# Patient Record
Sex: Female | Born: 1950 | Race: White | Hispanic: No | Marital: Married | Smoking: Current every day smoker
Health system: Southern US, Community
[De-identification: ages and names within clinical notes are randomized; demographics above are authoritative.]

## PROBLEM LIST (undated history)

## (undated) DIAGNOSIS — F32A Depression, unspecified: Secondary | ICD-10-CM

## (undated) DIAGNOSIS — Z780 Asymptomatic menopausal state: Secondary | ICD-10-CM

## (undated) DIAGNOSIS — F419 Anxiety disorder, unspecified: Secondary | ICD-10-CM

## (undated) DIAGNOSIS — F329 Major depressive disorder, single episode, unspecified: Secondary | ICD-10-CM

## (undated) HISTORY — DX: Anxiety disorder, unspecified: F41.9

## (undated) HISTORY — DX: Depression, unspecified: F32.A

## (undated) HISTORY — PX: CHOLECYSTECTOMY: SHX55

## (undated) HISTORY — DX: Asymptomatic menopausal state: Z78.0

## (undated) HISTORY — PX: OTHER SURGICAL HISTORY: SHX169

## (undated) HISTORY — DX: Major depressive disorder, single episode, unspecified: F32.9

---

## 2004-02-11 ENCOUNTER — Ambulatory Visit: Payer: Self-pay | Admitting: Family Medicine

## 2005-02-22 ENCOUNTER — Ambulatory Visit: Payer: Self-pay | Admitting: Family Medicine

## 2005-03-10 ENCOUNTER — Ambulatory Visit: Payer: Self-pay | Admitting: Family Medicine

## 2006-03-21 ENCOUNTER — Ambulatory Visit: Payer: Self-pay | Admitting: Family Medicine

## 2007-07-03 ENCOUNTER — Ambulatory Visit: Payer: Self-pay | Admitting: Family Medicine

## 2008-07-08 ENCOUNTER — Ambulatory Visit: Payer: Self-pay | Admitting: Family Medicine

## 2009-08-12 ENCOUNTER — Ambulatory Visit: Payer: Self-pay | Admitting: Family Medicine

## 2010-08-16 ENCOUNTER — Ambulatory Visit: Payer: Self-pay | Admitting: Family Medicine

## 2011-09-27 ENCOUNTER — Ambulatory Visit: Payer: Self-pay

## 2011-09-27 LAB — HM MAMMOGRAPHY

## 2012-09-29 LAB — CBC
HGB: 15.8 g/dL (ref 12.0–16.0)
MCHC: 34.8 g/dL (ref 32.0–36.0)
MCV: 92 fL (ref 80–100)
RDW: 13.2 % (ref 11.5–14.5)

## 2012-09-29 LAB — TROPONIN I: Troponin-I: <0.02 ng/mL

## 2012-09-29 LAB — BASIC METABOLIC PANEL
BUN: 11 mg/dL (ref 7–18)
EGFR (African American): >60
Osmolality: 268 (ref 275–301)
Potassium: 4 mmol/L (ref 3.5–5.1)

## 2012-09-30 LAB — CK TOTAL AND CKMB (NOT AT ARMC)
CK, Total: 114 U/L (ref 21–215)
CK-MB: <0.5 ng/mL — ABNORMAL LOW (ref 0.5–3.6)

## 2012-09-30 LAB — COMPREHENSIVE METABOLIC PANEL
Albumin: 3.3 g/dL — ABNORMAL LOW (ref 3.4–5.0)
Alkaline Phosphatase: 83 U/L (ref 50–136)
Anion Gap: 8 (ref 7–16)
BUN: 11 mg/dL (ref 7–18)
Bilirubin,Total: 0.5 mg/dL (ref 0.2–1.0)
Chloride: 105 mmol/L (ref 98–107)
Co2: 24 mmol/L (ref 21–32)
Creatinine: 0.65 mg/dL (ref 0.60–1.30)
EGFR (African American): >60
EGFR (Non-African Amer.): >60
Osmolality: 275 (ref 275–301)
Potassium: 3.8 mmol/L (ref 3.5–5.1)
SGPT (ALT): 41 U/L (ref 12–78)
Sodium: 137 mmol/L (ref 136–145)
Total Protein: 7.3 g/dL (ref 6.4–8.2)

## 2012-09-30 LAB — APTT: Activated PTT: 40.5 secs — ABNORMAL HIGH (ref 23.6–35.9)

## 2012-09-30 LAB — LIPASE, BLOOD: Lipase: 78 U/L (ref 73–393)

## 2012-09-30 LAB — TROPONIN I: Troponin-I: <0.02 ng/mL

## 2012-10-01 ENCOUNTER — Inpatient Hospital Stay: Payer: Self-pay | Admitting: Surgery

## 2012-10-01 LAB — CBC WITH DIFFERENTIAL/PLATELET
Basophil #: 0.1 10*3/uL (ref 0.0–0.1)
Basophil %: 0.4 %
HCT: 41 % (ref 35.0–47.0)
HGB: 14.2 g/dL (ref 12.0–16.0)
Lymphocyte #: 1.6 10*3/uL (ref 1.0–3.6)
MCHC: 34.8 g/dL (ref 32.0–36.0)
MCV: 94 fL (ref 80–100)
Monocyte #: 1 x10 3/mm — ABNORMAL HIGH (ref 0.2–0.9)
Monocyte %: 8.5 %
Platelet: 174 10*3/uL (ref 150–440)
RBC: 4.36 10*6/uL (ref 3.80–5.20)
RDW: 13.4 % (ref 11.5–14.5)
WBC: 12.1 10*3/uL — ABNORMAL HIGH (ref 3.6–11.0)

## 2012-10-01 LAB — BASIC METABOLIC PANEL
Anion Gap: 6 — ABNORMAL LOW (ref 7–16)
Chloride: 107 mmol/L (ref 98–107)
Co2: 25 mmol/L (ref 21–32)
Glucose: 119 mg/dL — ABNORMAL HIGH (ref 65–99)

## 2012-10-01 LAB — HEPATIC FUNCTION PANEL A (ARMC)
Albumin: 3 g/dL — ABNORMAL LOW (ref 3.4–5.0)
Bilirubin, Direct: 0.1 mg/dL (ref 0.00–0.20)
SGOT(AST): 61 U/L — ABNORMAL HIGH (ref 15–37)
SGPT (ALT): 69 U/L (ref 12–78)

## 2013-12-12 LAB — HM PAP SMEAR

## 2014-08-28 NOTE — Discharge Summary (Signed)
PATIENT NAME:  Priscilla [name], Priscilla [name] MR#:  [id number] DATE OF BIRTH:  04-Oct-1950  DATE OF ADMISSION:  10/01/2012 DATE OF DISCHARGE:  10/03/2012  BRIEF HISTORY OF PRESENT ILLNESS:  Newman PiesCarla Semidey, a 64 year old woman admitted through the Emergency Room with evidence consistent with acute cholecystitis.  She had a several day history of abdominal pain.  She had an elevated white blood cell count of 13,000.  Liver function studies were unremarkable.  Lipase was normal.  Ultrasound did demonstrate changes consistent with possible acute cholecystitis with mild gallbladder wall distention and an impacted cystic duct stone.  After appropriate preoperative preparation and informed consent, she was taken to surgery on the afternoon of 09/30/2012 where she underwent laparoscopic cholecystectomy.  She had an acutely inflamed gallbladder.  There was significant inflammatory change.  The gallbladder was removed laparoscopically, but a drain was left.  She had multiple problems with nausea and pain control early on.  Her symptoms resolved over the next 24 hours.  Drain was removed on May 28th.  This morning she is up and tolerating a diet with no complaints.  She is discharged home to follow in the office in 7 to 10 days' time.  Bathing, activity and driving instructions were given to the patient.   DISCHARGE MEDICATIONS:  She is to resume her progesterone medication, amitriptyline 25 mg by mouth at bedtime.  She is to take Vicodin for pain.   FINAL DISCHARGE DIAGNOSIS:  Acute cholecystitis.   PROCEDURE:  Laparoscopic cholecystectomy.     ____________________________ Carmie Endalph L. Ely III, MD rle:ea D: 10/03/2012 15:53:34 ET T: 10/04/2012 03:15:24 ET JOB#: [id number]  cc: Carmie Endalph L. Ely III, MD, <Dictator> Phillips Odorheryl A. Jamesetta OrleansWicker, NP Quentin OreALPH L ELY MD ELECTRONICALLY SIGNED 10/05/2012 9:43

## 2014-08-28 NOTE — H&P (Signed)
History of Present Illness 65 yowf who has had > 32 hours epigastric and RUQ pain associated with nausea and vomiting that persisted from after dinner Saturday night until she received her second dose of analgesics in [facility] ED about 130 AM. No fever, no chills, no change in teh color of her urine, stoll, skin, or eyes. No previous episodes of similar pain.   Past Med/Surgical Hx:  Denies:   DNC:   Ovarian Cyst Removal:   ALLERGIES:  NKA: None  HOME MEDICATIONS: Medication Instructions Status  progesterone  orally  Active   Family and Social History:  Family History Non-Contributory   Social History negative tobacco, negative ETOH, married   Place of Living Home   Review of Systems:  Fever/Chills No   Cough No   Sputum No   Abdominal Pain Yes   Diarrhea No   Constipation No   Nausea/Vomiting Yes   SOB/DOE No   Chest Pain No   Dysuria No   Tolerating PT Yes   Tolerating Diet No  Nauseated  Vomiting   Physical Exam:  GEN well developed, no acute distress, obese   HEENT pink conjunctivae, PERRL, hearing intact to voice, moist oral mucosa, Oropharynx clear   NECK supple   RESP normal resp effort  no use of accessory muscles   CARD regular rate  no murmur  no thrills  no JVD   ABD positive tenderness  soft  I elicited RUQ pain once but could not reproduce it   LYMPH negative neck   EXTR negative cyanosis/clubbing, negative edema   SKIN normal to palpation, No ulcers, skin turgor good   NEURO cranial nerves intact, negative tremor, follows commands, motor/sensory function intact   PSYCH asleep, easily aroused   Lab Results: Routine Chem:  25-May-14 20:37   Lipase 78 (Result(s) reported on 30 Sep 2012 at 12:10AM.)  Glucose, Serum  132  BUN 11  Creatinine (comp) 0.81  Sodium, Serum  133  Potassium, Serum 4.0  Chloride, Serum 101  CO2, Serum 25  Calcium (Total), Serum 9.5  Anion Gap 7  Osmolality (calc) 268  eGFR (African American) >60   eGFR (Non-African American) >60 (eGFR values <8m/min/1.73 m2 may be an indication of chronic kidney disease (CKD). Calculated eGFR is useful in patients with stable renal function. The eGFR calculation will not be reliable in acutely ill patients when serum creatinine is changing rapidly. It is not useful in  patients on dialysis. The eGFR calculation may not be applicable to patients at the low and high extremes of body sizes, pregnant women, and vegetarians.)  Cardiac:  25-May-14 20:37   Troponin I < 0.02 (0.00-0.05 0.05 ng/mL or less: NEGATIVE  Repeat testing in 3-6 hrs  if clinically indicated. >0.05 ng/mL: POTENTIAL  MYOCARDIAL INJURY. Repeat  testing in 3-6 hrs if  clinically indicated. NOTE: An increase or decrease  of 30% or more on serial  testing suggests a  clinically important change)  26-May-14 00:47   Troponin I < 0.02 (0.00-0.05 0.05 ng/mL or less: NEGATIVE  Repeat testing in 3-6 hrs  if clinically indicated. >0.05 ng/mL: POTENTIAL  MYOCARDIAL INJURY. Repeat  testing in 3-6 hrs if  clinically indicated. NOTE: An increase or decrease  of 30% or more on serial  testing suggests a  clinically important change)  CK, Total 114  CPK-MB, Serum  < 0.5 (Result(s) reported on 30 Sep 2012 at 01:14AM.)  Routine Hem:  25-May-14 20:37   WBC (CBC)  13.1  RBC (CBC) 4.91  Hemoglobin (CBC) 15.8  Hematocrit (CBC) 45.4  Platelet Count (CBC) 194 (Result(s) reported on 29 Sep 2012 at 08:56PM.)  MCV 92  MCH 32.2  MCHC 34.8  RDW 13.2    Assessment/Admission Diagnosis U/S - fatty liver, 2 cm stone in GB neck, no GB wall thickening or pericholecystic fluid. CBD "normal" in dimension.  A - early acute cholecystitis, obesity   Plan P - admit, obtain additional labs, IV ABx, lap CCY Pt and husband understand 1/200 risk of CBD injury and agree   Electronic Signatures: Consuela Mimes (MD)  (Signed 26-May-14 03:45)  Authored: CHIEF COMPLAINT and HISTORY, PAST  MEDICAL/SURGIAL HISTORY, ALLERGIES, HOME MEDICATIONS, FAMILY AND SOCIAL HISTORY, REVIEW OF SYSTEMS, PHYSICAL EXAM, LABS, ASSESSMENT AND PLAN   Last Updated: 26-May-14 03:45 by Consuela Mimes (MD)

## 2014-08-28 NOTE — Op Note (Signed)
PATIENT NAME:  Priscilla [name], Priscilla [name] MR#:  [id number] DATE OF BIRTH:  [birth year]  DATE OF PROCEDURE:  09/30/2012  PREOPERATIVE DIAGNOSIS:  Acute cholecystitis.   POSTOPERATIVE DIAGNOSIS:  Acute cholecystitis.   OPERATION:  Laparoscopic cholecystectomy.   SURGEON:  Quentin Orealph L. Ely, MD  ANESTHESIA:  General.  OPERATIVE PROCEDURE: With the patient in the supine position, after the induction of appropriate general anesthesia, the patient'[name] abdomen was prepped with ChloraPrep and draped with sterile towels. The patient was placed in the head-down, feet-up position. A small infraumbilical incision was made in the standard fashion, carried down bluntly through  subcutaneous tissue. The Veress needle was used to cannulate the peritoneal cavity. CO2 was insufflated to appropriate pressure measurements. When approximately 2.5 liters of CO2 were instilled, the Veress needle was withdrawn. An 11 mm [employer] port was inserted in the peritoneal cavity. Intraperitoneal position was confirmed. CO2 was re-insufflated. The patient was placed in the head-up, feet-down position and rotated slightly to the left side. A subxiphoid transverse incision was made, and an 11 mm port inserted under direct vision. Two lateral ports, 5 mm in size, were inserted under direct vision. The gallbladder was markedly distended, edematous, very tense, with some gangrenous changes over the dome of the gallbladder. It was aspirated of 60 mL of light green bile. The bile was very thick and syrupy. The gallbladder was then retracted superiorly and laterally, exposing the hepatoduodenal ligament. The cystic artery and cystic duct were identified. The cystic duct was quite short. There was one large stone in the neck of the gallbladder, was clearly obstructing. The cystic duct. No attempt was made to perform cholangiography because of the inflammatory changes and the short cystic duct. The duct was doubly clipped on both sides and divided. The cystic  artery was doubly clipped and divided. The gallbladder was then dissected free from its bed and delivered using hook and cautery apparatus. A small rent was made in the gallbladder, but no stones were lost. The gallbladder was then collected in an Endo Catch apparatus and  removed through the upper abdominal incision. There were several small bleeding sites in the bed of the liver from the significant inflammatory change. Electrocautery was used to control the bleeding. A 17 French round Blake drain was then placed, brought out through one of the separate stab wounds. EndoAvitene was inserted after irrigating the abdominal cavity with 2 liters of warm saline solution. The drain was secured with 3-0 nylon. The abdomen was then desufflated after closing the upper abdominal incision with figure-of-eight sutures of 0 Vicryl using the suture passer. Skin incisions were closed with 5-0 nylon. Sterile dressings were applied after infiltrating the area with 0.25% Marcaine for postoperative pain control. Sterile dressings were applied. The patient was returned to the recovery room in satisfactory condition.    ____________________________ Carmie Endalph L. Ely III, MD rle:mr D: 09/30/2012 19:04:00 ET T: 09/30/2012 19:16:09 ET JOB#: [id number]  cc: Carmie Endalph L. Ely III, MD, <Dictator> Phillips Odorheryl A. Jamesetta OrleansWicker, NP  Quentin OreALPH L ELY MD ELECTRONICALLY SIGNED 10/05/2012 9:42

## 2014-12-10 DIAGNOSIS — E669 Obesity, unspecified: Secondary | ICD-10-CM | POA: Insufficient documentation

## 2014-12-10 DIAGNOSIS — F329 Major depressive disorder, single episode, unspecified: Secondary | ICD-10-CM

## 2014-12-10 DIAGNOSIS — F419 Anxiety disorder, unspecified: Secondary | ICD-10-CM

## 2014-12-10 DIAGNOSIS — Z780 Asymptomatic menopausal state: Secondary | ICD-10-CM

## 2014-12-10 DIAGNOSIS — K589 Irritable bowel syndrome without diarrhea: Secondary | ICD-10-CM | POA: Insufficient documentation

## 2014-12-10 DIAGNOSIS — F32A Depression, unspecified: Secondary | ICD-10-CM

## 2014-12-14 ENCOUNTER — Ambulatory Visit (INDEPENDENT_AMBULATORY_CARE_PROVIDER_SITE_OTHER): Payer: BLUE CROSS/BLUE SHIELD | Admitting: Unknown Physician Specialty

## 2014-12-14 ENCOUNTER — Encounter: Payer: Self-pay | Admitting: Unknown Physician Specialty

## 2014-12-14 VITALS — BP 128/64 | HR 46 | Temp 98.1°F | Ht 60.5 in | Wt 135.6 lb

## 2014-12-14 DIAGNOSIS — F32A Depression, unspecified: Secondary | ICD-10-CM

## 2014-12-14 DIAGNOSIS — Z0000 Encounter for general adult medical examination without abnormal findings: Secondary | ICD-10-CM | POA: Diagnosis not present

## 2014-12-14 DIAGNOSIS — F329 Major depressive disorder, single episode, unspecified: Secondary | ICD-10-CM

## 2014-12-14 DIAGNOSIS — F419 Anxiety disorder, unspecified: Secondary | ICD-10-CM | POA: Diagnosis not present

## 2014-12-14 DIAGNOSIS — Z780 Asymptomatic menopausal state: Secondary | ICD-10-CM | POA: Diagnosis not present

## 2014-12-14 NOTE — Patient Instructions (Signed)
Find a therapist at "[facility]

## 2014-12-14 NOTE — Assessment & Plan Note (Signed)
Continue present medications.  Add counseling

## 2014-12-14 NOTE — Progress Notes (Signed)
BP 128/64 mmHg  Pulse 46  Temp(Src) 98.1 F (36.7 C)  Ht 5' 0.5" (1.537 m)  Wt 135 lb 9.6 oz (61.508 kg)  BMI 26.04 kg/m2  SpO2 99%  LMP  (LMP Unknown)   Subjective:    Patient ID: Priscilla [name], female    DOB: May 27, 1950, 64 y.o.   MRN: [id number]  HPI: Priscilla [name] is a 64 y.o. female  Chief Complaint  Patient presents with  . Annual Exam   Anxiety Having a lot of anxiety lately.  States she "shakes" having "heart palpitations" and feels like she is "losing control of myself."  No shortness of breath.  States a lot of it is work related.  States the Amitriptyline is helping with her sleep.  She has been taking the Prozac for quite some time and not quite sure if it is helping.  She has considered counseling and is getting some self-help through the internet which she finds helpful.   Relevant past medical, surgical, family and social history reviewed and updated as indicated. Interim medical history since our last visit reviewed. Allergies and medications reviewed and updated.  Review of Systems  Constitutional: Negative.   HENT: Negative.   Eyes: Negative.   Respiratory: Negative.   Cardiovascular: Negative.   Gastrointestinal: Negative.   Endocrine: Negative.   Genitourinary: Negative.   Musculoskeletal: Negative.   Skin: Negative.   Allergic/Immunologic: Negative.   Neurological: Negative.   Hematological: Negative.   Psychiatric/Behavioral: Negative.     Per HPI unless specifically indicated above     Objective:    BP 128/64 mmHg  Pulse 46  Temp(Src) 98.1 F (36.7 C)  Ht 5' 0.5" (1.537 m)  Wt 135 lb 9.6 oz (61.508 kg)  BMI 26.04 kg/m2  SpO2 99%  LMP  (LMP Unknown)  Wt Readings from Last 3 Encounters:  12/14/14 135 lb 9.6 oz (61.508 kg)  09/29/14 140 lb (63.504 kg)    Physical Exam  Constitutional: She is oriented to person, place, and time. She appears well-developed and well-nourished.  HENT:  Head: Normocephalic and atraumatic.  Eyes:  Pupils are equal, round, and reactive to light. Right eye exhibits no discharge. Left eye exhibits no discharge. No scleral icterus.  Neck: Normal range of motion. Neck supple. Carotid bruit is not present. No thyromegaly present.  Cardiovascular: Normal rate, regular rhythm and normal heart sounds.  Exam reveals no gallop and no friction rub.   No murmur heard. Pulmonary/Chest: Effort normal and breath sounds normal. No respiratory distress. She has no wheezes. She has no rales.  Abdominal: Soft. Bowel sounds are normal. There is no tenderness. There is no rebound.  Genitourinary: No breast swelling, tenderness or discharge.  Musculoskeletal: Normal range of motion.  Lymphadenopathy:    She has no cervical adenopathy.  Neurological: She is alert and oriented to person, place, and time.  Skin: Skin is warm, dry and intact. No rash noted.  Psychiatric: She has a normal mood and affect. Her speech is normal and behavior is normal. Judgment and thought content normal. Cognition and memory are normal.      Assessment & Plan:   Problem List Items Addressed This Visit      Unprioritized   Menopause   Relevant Orders   HIV antibody   Comprehensive metabolic panel   CBC with Differential/Platelet   Lipid Panel w/o Chol/HDL Ratio   TSH   Vit D  25 hydroxy (rtn osteoporosis monitoring)   Depression - Primary  Relevant Orders   HIV antibody   Comprehensive metabolic panel   CBC with Differential/Platelet   Lipid Panel w/o Chol/HDL Ratio   TSH   Vit D  25 hydroxy (rtn osteoporosis monitoring)   Chronic anxiety    Continue present medications.  Add counseling      Relevant Orders   HIV antibody   Comprehensive metabolic panel   CBC with Differential/Platelet   Lipid Panel w/o Chol/HDL Ratio   TSH   Vit D  25 hydroxy (rtn osteoporosis monitoring)    Other Visit Diagnoses    Routine general medical examination at a health care facility        Relevant Orders    HIV antibody     Comprehensive metabolic panel    CBC with Differential/Platelet    Lipid Panel w/o Chol/HDL Ratio    TSH    Vit D  25 hydroxy (rtn osteoporosis monitoring)    MM DIGITAL SCREENING BILATERAL        Follow up plan: Return in about 3 months (around 03/16/2015).

## 2014-12-15 ENCOUNTER — Encounter: Payer: Self-pay | Admitting: Unknown Physician Specialty

## 2014-12-15 LAB — CBC WITH DIFFERENTIAL/PLATELET
BASOS: 1 %
Basophils Absolute: 0.1 10*3/uL (ref 0.0–0.2)
EOS (ABSOLUTE): 0.1 10*3/uL (ref 0.0–0.4)
Eos: 1 %
Hematocrit: 47.6 % — ABNORMAL HIGH (ref 34.0–46.6)
Hemoglobin: 15.9 g/dL (ref 11.1–15.9)
Immature Grans (Abs): 0 10*3/uL (ref 0.0–0.1)
Immature Granulocytes: 0 %
Lymphocytes Absolute: 1.5 10*3/uL (ref 0.7–3.1)
Lymphs: 16 %
MCH: 32.1 pg (ref 26.6–33.0)
MCHC: 33.4 g/dL (ref 31.5–35.7)
MCV: 96 fL (ref 79–97)
Monocytes Absolute: 0.6 10*3/uL (ref 0.1–0.9)
Monocytes: 6 %
Neutrophils Absolute: 7.2 10*3/uL — ABNORMAL HIGH (ref 1.4–7.0)
Neutrophils: 76 %
Platelets: 227 10*3/uL (ref 150–379)
RBC: 4.96 x10E6/uL (ref 3.77–5.28)
RDW: 13.2 % (ref 12.3–15.4)
WBC: 9.4 10*3/uL (ref 3.4–10.8)

## 2014-12-15 LAB — COMPREHENSIVE METABOLIC PANEL
ALBUMIN: 4.3 g/dL (ref 3.6–4.8)
ALT: 16 IU/L (ref 0–32)
AST: 16 IU/L (ref 0–40)
Albumin/Globulin Ratio: 1.7 (ref 1.1–2.5)
Alkaline Phosphatase: 87 IU/L (ref 39–117)
BUN / CREAT RATIO: 10 — AB (ref 11–26)
BUN: 8 mg/dL (ref 8–27)
Bilirubin Total: <0.2 mg/dL (ref 0.0–1.2)
CO2: 24 mmol/L (ref 18–29)
Calcium: 10.3 mg/dL (ref 8.7–10.3)
Chloride: 99 mmol/L (ref 97–108)
Creatinine, Ser: 0.83 mg/dL (ref 0.57–1.00)
GFR calc Af Amer: 87 mL/min/{1.73_m2} (ref >59)
GFR calc non Af Amer: 75 mL/min/{1.73_m2} (ref >59)
GLOBULIN, TOTAL: 2.6 g/dL (ref 1.5–4.5)
GLUCOSE: 98 mg/dL (ref 65–99)
Potassium: 4.7 mmol/L (ref 3.5–5.2)
Sodium: 140 mmol/L (ref 134–144)
Total Protein: 6.9 g/dL (ref 6.0–8.5)

## 2014-12-15 LAB — LIPID PANEL W/O CHOL/HDL RATIO
CHOLESTEROL TOTAL: 225 mg/dL — AB (ref 100–199)
HDL: 54 mg/dL (ref >39)
LDL Calculated: 135 mg/dL — ABNORMAL HIGH (ref 0–99)
TRIGLYCERIDES: 181 mg/dL — AB (ref 0–149)
VLDL Cholesterol Cal: 36 mg/dL (ref 5–40)

## 2014-12-15 LAB — TSH: TSH: 2.32 u[IU]/mL (ref 0.450–4.500)

## 2014-12-15 LAB — VITAMIN D 25 HYDROXY (VIT D DEFICIENCY, FRACTURES): Vit D, 25-Hydroxy: 33 ng/mL (ref 30.0–100.0)

## 2014-12-15 LAB — HIV ANTIBODY (ROUTINE TESTING W REFLEX): HIV SCREEN 4TH GENERATION: NONREACTIVE

## 2015-02-02 ENCOUNTER — Other Ambulatory Visit: Payer: Self-pay

## 2015-02-02 MED ORDER — PROGESTERONE MICRONIZED 100 MG PO CAPS: 100.0000 mg | ORAL_CAPSULE | Freq: Every day | ORAL | Status: DC

## 2015-02-02 NOTE — Telephone Encounter (Signed)
Patient was last seen 12/14/14 and pharmacy is [facility].

## 2015-02-10 ENCOUNTER — Other Ambulatory Visit: Payer: Self-pay | Admitting: Unknown Physician Specialty

## 2015-02-10 ENCOUNTER — Other Ambulatory Visit: Payer: Self-pay

## 2015-02-12 ENCOUNTER — Other Ambulatory Visit: Payer: Self-pay | Admitting: Unknown Physician Specialty

## 2015-02-12 MED ORDER — NONFORMULARY OR COMPOUNDED ITEM: 120.0000 mg | Freq: Every day | Status: DC

## 2015-03-16 ENCOUNTER — Ambulatory Visit (INDEPENDENT_AMBULATORY_CARE_PROVIDER_SITE_OTHER): Payer: BLUE CROSS/BLUE SHIELD | Admitting: Unknown Physician Specialty

## 2015-03-16 ENCOUNTER — Encounter: Payer: Self-pay | Admitting: Unknown Physician Specialty

## 2015-03-16 VITALS — BP 149/85 | HR 90 | Temp 98.2°F | Ht 61.1 in | Wt 139.8 lb

## 2015-03-16 DIAGNOSIS — F329 Major depressive disorder, single episode, unspecified: Secondary | ICD-10-CM

## 2015-03-16 DIAGNOSIS — F419 Anxiety disorder, unspecified: Secondary | ICD-10-CM

## 2015-03-16 DIAGNOSIS — F32A Depression, unspecified: Secondary | ICD-10-CM

## 2015-03-16 NOTE — Progress Notes (Signed)
BP 149/85 mmHg  Pulse 90  Temp(Src) 98.2 F (36.8 C)  Ht 5' 1.1" (1.552 m)  Wt 139 lb 12.8 oz (63.413 kg)  BMI 26.33 kg/m2  SpO2 98%  LMP  (LMP Unknown)   Subjective:    Patient ID: Priscilla [name], female    DOB: [birth year], 64 y.o.   MRN: [id number]  HPI: Priscilla [name] is a 64 y.o. female  Chief Complaint  Patient presents with  . Depression  . Anxiety   Depression Pt states that she was not able to find a counselor who she feels will meet her needs.  She is using a web site and using modules for healing.  She is getting support in the community one the web site.   Depression screen PHQ 2/9 03/16/2015  Decreased Interest 3  Down, Depressed, Hopeless 3  PHQ - 2 Score 6  Altered sleeping 0  Tired, decreased energy 1  Change in appetite 0  Feeling bad or failure about yourself  3  Trouble concentrating 2  Moving slowly or fidgety/restless 0  Suicidal thoughts 0  PHQ-9 Score 12  Difficult doing work/chores Very difficult   GAD 7 : Generalized Anxiety Score 03/16/2015  Nervous, Anxious, on Edge 3  Control/stop worrying 0  Worry too much - different things 0  Trouble relaxing 0  Restless 0  Easily annoyed or irritable 0  Afraid - awful might happen 3  Total GAD 7 Score 6      Relevant past medical, surgical, family and social history reviewed and updated as indicated. Interim medical history since our last visit reviewed. Allergies and medications reviewed and updated.  Review of Systems  Per HPI unless specifically indicated above     Objective:    BP 149/85 mmHg  Pulse 90  Temp(Src) 98.2 F (36.8 C)  Ht 5' 1.1" (1.552 m)  Wt 139 lb 12.8 oz (63.413 kg)  BMI 26.33 kg/m2  SpO2 98%  LMP  (LMP Unknown)  Wt Readings from Last 3 Encounters:  03/16/15 139 lb 12.8 oz (63.413 kg)  12/14/14 135 lb 9.6 oz (61.508 kg)  09/29/14 140 lb (63.504 kg)    Physical Exam  Constitutional: She is oriented to person, place, and time. She appears well-developed and  well-nourished. No distress.  HENT:  Head: Normocephalic and atraumatic.  Eyes: Conjunctivae and lids are normal. Right eye exhibits no discharge. Left eye exhibits no discharge. No scleral icterus.  Cardiovascular: Normal rate, regular rhythm and normal heart sounds.   Pulmonary/Chest: Effort normal and breath sounds normal. No respiratory distress.  Abdominal: Normal appearance. There is no splenomegaly or hepatomegaly.  Musculoskeletal: Normal range of motion.  Neurological: She is alert and oriented to person, place, and time.  Skin: Skin is intact. No rash noted. No pallor.  Psychiatric: She has a normal mood and affect. Her behavior is normal. Judgment and thought content normal.    Results for orders placed or performed in visit on 12/14/14  HIV antibody  Result Value Ref Range   HIV Screen 4th Generation wRfx Non Reactive Non Reactive  Comprehensive metabolic panel  Result Value Ref Range   Glucose 98 65 - 99 mg/dL   BUN 8 8 - 27 mg/dL   Creatinine, Ser 4.090.83 0.57 - 1.00 mg/dL   GFR calc non Af Amer 75 >59 mL/min/1.73   GFR calc Af Amer 87 >59 mL/min/1.73   BUN/Creatinine Ratio 10 (L) 11 - 26   Sodium 140 134 - 144 mmol/L  Potassium 4.7 3.5 - 5.2 mmol/L   Chloride 99 97 - 108 mmol/L   CO2 24 18 - 29 mmol/L   Calcium 10.3 8.7 - 10.3 mg/dL   Total Protein 6.9 6.0 - 8.5 g/dL   Albumin 4.3 3.6 - 4.8 g/dL   Globulin, Total 2.6 1.5 - 4.5 g/dL   Albumin/Globulin Ratio 1.7 1.1 - 2.5   Bilirubin Total <0.2 0.0 - 1.2 mg/dL   Alkaline Phosphatase 87 39 - 117 IU/L   AST 16 0 - 40 IU/L   ALT 16 0 - 32 IU/L  CBC with Differential/Platelet  Result Value Ref Range   WBC 9.4 3.4 - 10.8 x10E3/uL   RBC 4.96 3.77 - 5.28 x10E6/uL   Hemoglobin 15.9 11.1 - 15.9 g/dL   Hematocrit 16.1 (H) 09.6 - 46.6 %   MCV 96 79 - 97 fL   MCH 32.1 26.6 - 33.0 pg   MCHC 33.4 31.5 - 35.7 g/dL   RDW 04.5 40.9 - 81.1 %   Platelets 227 150 - 379 x10E3/uL   Neutrophils 76 %   Lymphs 16 %   Monocytes 6  %   Eos 1 %   Basos 1 %   Neutrophils Absolute 7.2 (H) 1.4 - 7.0 x10E3/uL   Lymphocytes Absolute 1.5 0.7 - 3.1 x10E3/uL   Monocytes Absolute 0.6 0.1 - 0.9 x10E3/uL   EOS (ABSOLUTE) 0.1 0.0 - 0.4 x10E3/uL   Basophils Absolute 0.1 0.0 - 0.2 x10E3/uL   Immature Granulocytes 0 %   Immature Grans (Abs) 0.0 0.0 - 0.1 x10E3/uL  Lipid Panel w/o Chol/HDL Ratio  Result Value Ref Range   Cholesterol, Total 225 (H) 100 - 199 mg/dL   Triglycerides 914 (H) 0 - 149 mg/dL   HDL 54 >78 mg/dL   VLDL Cholesterol Cal 36 5 - 40 mg/dL   LDL Calculated 295 (H) 0 - 99 mg/dL  TSH  Result Value Ref Range   TSH 2.320 0.450 - 4.500 uIU/mL  Vit D  25 hydroxy (rtn osteoporosis monitoring)  Result Value Ref Range   Vit D, 25-Hydroxy 33.0 30.0 - 100.0 ng/mL      Assessment & Plan:   Problem List Items Addressed This Visit      Unprioritized   Depression - Primary   Chronic anxiety    Pt still having issues that I feel counseling would help with. I am again encouraging her to go to counseling.    Follow up plan: Return in about 3 months (around 06/16/2015).

## 2015-10-15 ENCOUNTER — Other Ambulatory Visit: Payer: Self-pay

## 2015-10-15 MED ORDER — AMITRIPTYLINE HCL 25 MG PO TABS: 25.0000 mg | ORAL_TABLET | Freq: Every day | ORAL | Status: DC

## 2015-10-15 NOTE — Telephone Encounter (Signed)
Patient was last seen 03/16/15. [facility]

## 2015-12-07 ENCOUNTER — Telehealth: Payer: Self-pay

## 2015-12-07 MED ORDER — AMITRIPTYLINE HCL 25 MG PO TABS: 50.0000 mg | ORAL_TABLET | Freq: Every day | ORAL | 1 refills | Status: DC

## 2015-12-07 NOTE — Telephone Encounter (Signed)
Pharmacy sent a refill request for patient's amitriptyline. We wrote for 1 tablet daily but pharmacy is saying 2 tablets daily. So I called the patient and she states that some days she takes 2 tablets and some days she takes one tablet. Pharmacy needs rx to say 2 tablets daily. Pharmacy is [facility].

## 2015-12-23 ENCOUNTER — Other Ambulatory Visit: Payer: Self-pay | Admitting: Unknown Physician Specialty

## 2015-12-23 NOTE — Telephone Encounter (Signed)
Patient has appointment 02/09/16. Does she need another before then?

## 2015-12-23 NOTE — Telephone Encounter (Signed)
Needs an appointment. Will get her enough medicine to make it to appointment when it's booked.   

## 2016-02-09 ENCOUNTER — Encounter: Payer: Self-pay | Admitting: Unknown Physician Specialty

## 2016-02-09 ENCOUNTER — Ambulatory Visit (INDEPENDENT_AMBULATORY_CARE_PROVIDER_SITE_OTHER): Payer: Managed Care, Other (non HMO) | Admitting: Unknown Physician Specialty

## 2016-02-09 VITALS — BP 107/73 | HR 98 | Temp 98.3°F | Ht 60.5 in | Wt 141.8 lb

## 2016-02-09 DIAGNOSIS — F17210 Nicotine dependence, cigarettes, uncomplicated: Secondary | ICD-10-CM | POA: Insufficient documentation

## 2016-02-09 DIAGNOSIS — F3341 Major depressive disorder, recurrent, in partial remission: Secondary | ICD-10-CM | POA: Diagnosis not present

## 2016-02-09 DIAGNOSIS — K582 Mixed irritable bowel syndrome: Secondary | ICD-10-CM | POA: Diagnosis not present

## 2016-02-09 DIAGNOSIS — Z720 Tobacco use: Secondary | ICD-10-CM | POA: Diagnosis not present

## 2016-02-09 DIAGNOSIS — Z0000 Encounter for general adult medical examination without abnormal findings: Secondary | ICD-10-CM

## 2016-02-09 NOTE — Assessment & Plan Note (Signed)
Working on quitting smoking. 

## 2016-02-09 NOTE — Assessment & Plan Note (Signed)
Stable

## 2016-02-09 NOTE — Patient Instructions (Addendum)
Please do call to schedule your mammogram; the number to schedule one at either [facility] or [facility] is [phone]  [facility] Dr. Konrad FelixBredsen.  Also check out his appearance on [facility]

## 2016-02-09 NOTE — Progress Notes (Signed)
BP 107/73 (BP Location: Left Arm, Patient Position: Sitting, Cuff Size: Large)   Pulse 98   Temp 98.3 F (36.8 C)   Ht 5' 0.5" (1.537 m)   Wt 141 lb 12.8 oz (64.3 kg)   LMP  (LMP Unknown)   SpO2 98%   BMI 27.24 kg/m    Subjective:    Patient ID: Priscilla [name], female    DOB: 05/14/1950, 65 y.o.   MRN: [id number]  HPI: Priscilla [name] is a 65 y.o. female  Chief Complaint  Patient presents with  . Annual Exam   Depression Pt is doing well and has no complaints.   Depression screen [facility] 2/9 02/09/2016 03/16/2015  Decreased Interest 0 3  Down, Depressed, Hopeless 0 3  PHQ - 2 Score 0 6  Altered sleeping - 0  Tired, decreased energy - 1  Change in appetite - 0  Feeling bad or failure about yourself  - 3  Trouble concentrating - 2  Moving slowly or fidgety/restless - 0  Suicidal thoughts - 0  PHQ-9 Score - 12  Difficult doing work/chores - Very difficult   Tobacco Planning on quitting  Social History   Social History  . Marital status: Married    Spouse name: N/A  . Number of children: N/A  . Years of education: N/A   Occupational History  . Not on file.   Social History Main Topics  . Smoking status: Current Every Day Smoker    Packs/day: 0.50    Types: Cigarettes  . Smokeless tobacco: Never Used  . Alcohol use No  . Drug use: No  . Sexual activity: Yes   Other Topics Concern  . Not on file   Social History Narrative  . No narrative on file   Family History  Problem Relation Age of Onset  . Dementia Mother   . Thyroid disease Mother   . Hypertension Mother   . Arthritis Mother   . Diabetes Mother   . Diabetes Father    Past Medical History:  Diagnosis Date  . Anxiety   . Depression   . Menopause    Past Surgical History:  Procedure Laterality Date  . CHOLECYSTECTOMY    . ovarian cyst rupture     Relevant past medical, surgical, family and social history reviewed and updated as indicated. Interim medical history since our last visit  reviewed. Allergies and medications reviewed and updated.  Review of Systems  Per HPI unless specifically indicated above     Objective:    BP 107/73 (BP Location: Left Arm, Patient Position: Sitting, Cuff Size: Large)   Pulse 98   Temp 98.3 F (36.8 C)   Ht 5' 0.5" (1.537 m)   Wt 141 lb 12.8 oz (64.3 kg)   LMP  (LMP Unknown)   SpO2 98%   BMI 27.24 kg/m   Wt Readings from Last 3 Encounters:  02/09/16 141 lb 12.8 oz (64.3 kg)  03/16/15 139 lb 12.8 oz (63.4 kg)  12/14/14 135 lb 9.6 oz (61.5 kg)    Physical Exam  Constitutional: She is oriented to person, place, and time. She appears well-developed and well-nourished. No distress.  HENT:  Head: Normocephalic and atraumatic.  Eyes: Conjunctivae and lids are normal. Pupils are equal, round, and reactive to light. Right eye exhibits no discharge. Left eye exhibits no discharge. No scleral icterus.  Neck: Normal range of motion. Neck supple. No JVD present. Carotid bruit is not present. No thyromegaly present.  Cardiovascular: Normal  rate, regular rhythm and normal heart sounds.  Exam reveals no gallop and no friction rub.   No murmur heard. Pulmonary/Chest: Effort normal and breath sounds normal. No respiratory distress. She has no wheezes. She has no rales.  Abdominal: Soft. Normal appearance and bowel sounds are normal. There is no splenomegaly or hepatomegaly. There is no tenderness. There is no rebound.  Genitourinary: No breast swelling, tenderness or discharge.  Musculoskeletal: Normal range of motion.  Lymphadenopathy:    She has no cervical adenopathy.  Neurological: She is alert and oriented to person, place, and time.  Skin: Skin is warm, dry and intact. No rash noted. No pallor.  Psychiatric: She has a normal mood and affect. Her speech is normal and behavior is normal. Judgment and thought content normal. Cognition and memory are normal.   Refusing breast exam  Results for orders placed or performed in visit on  12/14/14  HIV antibody  Result Value Ref Range   HIV Screen 4th Generation wRfx Non Reactive Non Reactive  Comprehensive metabolic panel  Result Value Ref Range   Glucose 98 65 - 99 mg/dL   BUN 8 8 - 27 mg/dL   Creatinine, Ser 1.61 0.57 - 1.00 mg/dL   GFR calc non Af Amer 75 >59 mL/min/1.73   GFR calc Af Amer 87 >59 mL/min/1.73   BUN/Creatinine Ratio 10 (L) 11 - 26   Sodium 140 134 - 144 mmol/L   Potassium 4.7 3.5 - 5.2 mmol/L   Chloride 99 97 - 108 mmol/L   CO2 24 18 - 29 mmol/L   Calcium 10.3 8.7 - 10.3 mg/dL   Total Protein 6.9 6.0 - 8.5 g/dL   Albumin 4.3 3.6 - 4.8 g/dL   Globulin, Total 2.6 1.5 - 4.5 g/dL   Albumin/Globulin Ratio 1.7 1.1 - 2.5   Bilirubin Total <0.2 0.0 - 1.2 mg/dL   Alkaline Phosphatase 87 39 - 117 IU/L   AST 16 0 - 40 IU/L   ALT 16 0 - 32 IU/L  CBC with Differential/Platelet  Result Value Ref Range   WBC 9.4 3.4 - 10.8 x10E3/uL   RBC 4.96 3.77 - 5.28 x10E6/uL   Hemoglobin 15.9 11.1 - 15.9 g/dL   Hematocrit 09.6 (H) 04.5 - 46.6 %   MCV 96 79 - 97 fL   MCH 32.1 26.6 - 33.0 pg   MCHC 33.4 31.5 - 35.7 g/dL   RDW 40.9 81.1 - 91.4 %   Platelets 227 150 - 379 x10E3/uL   Neutrophils 76 %   Lymphs 16 %   Monocytes 6 %   Eos 1 %   Basos 1 %   Neutrophils Absolute 7.2 (H) 1.4 - 7.0 x10E3/uL   Lymphocytes Absolute 1.5 0.7 - 3.1 x10E3/uL   Monocytes Absolute 0.6 0.1 - 0.9 x10E3/uL   EOS (ABSOLUTE) 0.1 0.0 - 0.4 x10E3/uL   Basophils Absolute 0.1 0.0 - 0.2 x10E3/uL   Immature Granulocytes 0 %   Immature Grans (Abs) 0.0 0.0 - 0.1 x10E3/uL  Lipid Panel w/o Chol/HDL Ratio  Result Value Ref Range   Cholesterol, Total 225 (H) 100 - 199 mg/dL   Triglycerides 782 (H) 0 - 149 mg/dL   HDL 54 >95 mg/dL   VLDL Cholesterol Cal 36 5 - 40 mg/dL   LDL Calculated 621 (H) 0 - 99 mg/dL  TSH  Result Value Ref Range   TSH 2.320 0.450 - 4.500 uIU/mL  Vit D  25 hydroxy (rtn osteoporosis monitoring)  Result Value Ref Range  Vit D, 25-Hydroxy 33.0 30.0 - 100.0 ng/mL       Assessment & Plan:   Problem List Items Addressed This Visit      Unprioritized   Depression   Relevant Orders   VITAMIN D 25 Hydroxy (Vit-D Deficiency, Fractures)   IBS (irritable bowel syndrome)    Stable       Tobacco abuse    Working on quitting smoking       Other Visit Diagnoses    Annual physical exam    -  Primary   Relevant Orders   Hepatitis C antibody   CBC with Differential/Platelet   Comprehensive metabolic panel   Lipid Panel w/o Chol/HDL Ratio   MM DIGITAL SCREENING BILATERAL   TSH       Follow up plan: Return in about 1 year (around 02/08/2017).

## 2016-02-10 LAB — COMPREHENSIVE METABOLIC PANEL
ALBUMIN: 4.1 g/dL (ref 3.6–4.8)
ALK PHOS: 80 IU/L (ref 39–117)
ALT: 14 IU/L (ref 0–32)
AST: 14 IU/L (ref 0–40)
Albumin/Globulin Ratio: 1.5 (ref 1.2–2.2)
BUN / CREAT RATIO: 13 (ref 12–28)
BUN: 10 mg/dL (ref 8–27)
Bilirubin Total: 0.3 mg/dL (ref 0.0–1.2)
CO2: 25 mmol/L (ref 18–29)
CREATININE: 0.79 mg/dL (ref 0.57–1.00)
Calcium: 10 mg/dL (ref 8.7–10.3)
Chloride: 103 mmol/L (ref 96–106)
GFR calc non Af Amer: 79 mL/min/{1.73_m2} (ref >59)
GFR, EST AFRICAN AMERICAN: 91 mL/min/{1.73_m2} (ref >59)
GLOBULIN, TOTAL: 2.7 g/dL (ref 1.5–4.5)
Glucose: 82 mg/dL (ref 65–99)
Potassium: 5.1 mmol/L (ref 3.5–5.2)
SODIUM: 141 mmol/L (ref 134–144)
TOTAL PROTEIN: 6.8 g/dL (ref 6.0–8.5)

## 2016-02-10 LAB — CBC WITH DIFFERENTIAL/PLATELET
Basophils Absolute: 0 10*3/uL (ref 0.0–0.2)
Basos: 1 %
EOS (ABSOLUTE): 0.1 10*3/uL (ref 0.0–0.4)
EOS: 2 %
HEMATOCRIT: 45.6 % (ref 34.0–46.6)
HEMOGLOBIN: 15.8 g/dL (ref 11.1–15.9)
IMMATURE GRANS (ABS): 0 10*3/uL (ref 0.0–0.1)
Immature Granulocytes: 0 %
LYMPHS ABS: 1.8 10*3/uL (ref 0.7–3.1)
LYMPHS: 31 %
MCH: 32.9 pg (ref 26.6–33.0)
MCHC: 34.6 g/dL (ref 31.5–35.7)
MCV: 95 fL (ref 79–97)
MONOCYTES: 7 %
Monocytes Absolute: 0.4 10*3/uL (ref 0.1–0.9)
NEUTROS ABS: 3.5 10*3/uL (ref 1.4–7.0)
Neutrophils: 59 %
Platelets: 206 10*3/uL (ref 150–379)
RBC: 4.8 x10E6/uL (ref 3.77–5.28)
RDW: 13.3 % (ref 12.3–15.4)
WBC: 5.9 10*3/uL (ref 3.4–10.8)

## 2016-02-10 LAB — LIPID PANEL W/O CHOL/HDL RATIO
CHOLESTEROL TOTAL: 205 mg/dL — AB (ref 100–199)
HDL: 54 mg/dL (ref >39)
LDL CALC: 123 mg/dL — AB (ref 0–99)
Triglycerides: 142 mg/dL (ref 0–149)
VLDL CHOLESTEROL CAL: 28 mg/dL (ref 5–40)

## 2016-02-10 LAB — TSH: TSH: 1.03 u[IU]/mL (ref 0.450–4.500)

## 2016-02-10 LAB — HEPATITIS C ANTIBODY: Hep C Virus Ab: <0.1 s/co ratio (ref 0.0–0.9)

## 2016-02-10 LAB — VITAMIN D 25 HYDROXY (VIT D DEFICIENCY, FRACTURES): VIT D 25 HYDROXY: 36 ng/mL (ref 30.0–100.0)

## 2016-02-11 NOTE — Progress Notes (Signed)
Notified pt by mychart

## 2016-03-10 ENCOUNTER — Telehealth: Payer: Self-pay

## 2016-03-10 MED ORDER — NONFORMULARY OR COMPOUNDED ITEM
125.0000 mg | Freq: Every day | Status: DC
Start: 2016-03-10 — End: 2016-03-10

## 2016-03-10 MED ORDER — NONFORMULARY OR COMPOUNDED ITEM: 125.0000 mg | Freq: Every day | 3 refills | Status: DC

## 2016-03-10 NOTE — Telephone Encounter (Signed)
RX faxed to pharmacy.

## 2016-03-10 NOTE — Telephone Encounter (Signed)
Written and unable to be e-scribed

## 2016-03-10 NOTE — Telephone Encounter (Signed)
Got a refill request for patient's progesterone. It is listed in the chart as the nonformulary or compunded item. The current rx is for 120 mg but patient stated that she was taking 125 mg. Can we write a new rx for 125 mg and it has to be sent to [facility] please.

## 2016-03-10 NOTE — Telephone Encounter (Signed)
RX does not have dispense number or refills on it. It also says no print. I can call it in if you can't change to normal but I need to know dispense amount and how many refills.

## 2016-05-19 ENCOUNTER — Other Ambulatory Visit: Payer: Self-pay | Admitting: Family Medicine

## 2016-06-22 ENCOUNTER — Other Ambulatory Visit: Payer: Self-pay | Admitting: Unknown Physician Specialty

## 2016-06-22 NOTE — Telephone Encounter (Signed)
Routing to provider. Next appt 02/12/17.

## 2016-06-23 ENCOUNTER — Other Ambulatory Visit: Payer: Self-pay | Admitting: Unknown Physician Specialty

## 2016-06-23 NOTE — Telephone Encounter (Signed)
Your patient 

## 2016-08-01 ENCOUNTER — Other Ambulatory Visit: Payer: Self-pay | Admitting: Unknown Physician Specialty

## 2016-09-19 ENCOUNTER — Other Ambulatory Visit: Payer: Self-pay | Admitting: Unknown Physician Specialty

## 2016-11-07 ENCOUNTER — Other Ambulatory Visit: Payer: Self-pay | Admitting: Unknown Physician Specialty

## 2016-12-05 ENCOUNTER — Other Ambulatory Visit: Payer: Self-pay | Admitting: Family Medicine

## 2016-12-05 NOTE — Telephone Encounter (Signed)
Last OV: 02/09/16 Next OV: 02/12/17  BMP Latest Ref Rng & Units 02/09/2016 12/14/2014 10/01/2012  Glucose 65 - 99 mg/dL 82 98 161(W119(H)  BUN 8 - 27 mg/dL 10 8 9   Creatinine 0.57 - 1.00 mg/dL 9.600.79 4.540.83 0.980.76  BUN/Creat Ratio 12 - 28 13 10(L) -  Sodium 134 - 144 mmol/L 141 140 138  Potassium 3.5 - 5.2 mmol/L 5.1 4.7 4.2  Chloride 96 - 106 mmol/L 103 99 107  CO2 18 - 29 mmol/L 25 24 25   Calcium 8.7 - 10.3 mg/dL 11.910.0 14.710.3 8.6

## 2017-01-09 ENCOUNTER — Other Ambulatory Visit: Payer: Self-pay | Admitting: Unknown Physician Specialty

## 2017-01-26 ENCOUNTER — Other Ambulatory Visit: Payer: Self-pay | Admitting: Unknown Physician Specialty

## 2017-02-12 ENCOUNTER — Encounter: Payer: Self-pay | Admitting: Unknown Physician Specialty

## 2017-02-12 ENCOUNTER — Ambulatory Visit (INDEPENDENT_AMBULATORY_CARE_PROVIDER_SITE_OTHER): Payer: Medicare Other | Admitting: Unknown Physician Specialty

## 2017-02-12 DIAGNOSIS — Z720 Tobacco use: Secondary | ICD-10-CM | POA: Diagnosis not present

## 2017-02-12 DIAGNOSIS — H9193 Unspecified hearing loss, bilateral: Secondary | ICD-10-CM

## 2017-02-12 DIAGNOSIS — Z0000 Encounter for general adult medical examination without abnormal findings: Secondary | ICD-10-CM

## 2017-02-12 DIAGNOSIS — F3341 Major depressive disorder, recurrent, in partial remission: Secondary | ICD-10-CM | POA: Diagnosis not present

## 2017-02-12 DIAGNOSIS — K582 Mixed irritable bowel syndrome: Secondary | ICD-10-CM

## 2017-02-12 DIAGNOSIS — Z7189 Other specified counseling: Secondary | ICD-10-CM

## 2017-02-12 DIAGNOSIS — E2839 Other primary ovarian failure: Secondary | ICD-10-CM | POA: Diagnosis not present

## 2017-02-12 DIAGNOSIS — Z1231 Encounter for screening mammogram for malignant neoplasm of breast: Secondary | ICD-10-CM

## 2017-02-12 DIAGNOSIS — Z13220 Encounter for screening for lipoid disorders: Secondary | ICD-10-CM

## 2017-02-12 DIAGNOSIS — Z5181 Encounter for therapeutic drug level monitoring: Secondary | ICD-10-CM | POA: Diagnosis not present

## 2017-02-12 DIAGNOSIS — H9190 Unspecified hearing loss, unspecified ear: Secondary | ICD-10-CM | POA: Insufficient documentation

## 2017-02-12 MED ORDER — FLUOXETINE HCL 20 MG PO CAPS: 20.0000 mg | ORAL_CAPSULE | Freq: Every day | ORAL | 3 refills | Status: DC

## 2017-02-12 MED ORDER — HYOSCYAMINE SULFATE ER 0.375 MG PO TB12: 0.3750 mg | ORAL_TABLET | Freq: Two times a day (BID) | ORAL | 1 refills | Status: DC | PRN

## 2017-02-12 MED ORDER — AMITRIPTYLINE HCL 25 MG PO TABS: 50.0000 mg | ORAL_TABLET | Freq: Every day | ORAL | 3 refills | Status: DC

## 2017-02-12 NOTE — Assessment & Plan Note (Signed)
Uses levisin on occasion

## 2017-02-12 NOTE — Assessment & Plan Note (Signed)
Stable, continue present medications.   

## 2017-02-12 NOTE — Assessment & Plan Note (Signed)
Refer to ENT for further evaluation. ?

## 2017-02-12 NOTE — Progress Notes (Signed)
BP 129/81   Pulse (!) 106   Temp 98.6 F (37 C)   Ht 5' 0.2" (1.529 m)   Wt 137 lb 12.8 oz (62.5 kg)   LMP  (LMP Unknown)   SpO2 98%   BMI 26.73 kg/m    Subjective:    Patient ID: Priscilla [name], female    DOB: Mar 29, 1951, 66 y.o.   MRN: [id number]  HPI: Priscilla [name] is a 66 y.o. female  Chief Complaint  Patient presents with  . Medicare Wellness   Functional Status Survey: Is the patient deaf or have difficulty hearing?: Yes Does the patient have difficulty seeing, even when wearing glasses/contacts?: No Does the patient have difficulty concentrating, remembering, or making decisions?: No Does the patient have difficulty walking or climbing stairs?: No Does the patient have difficulty dressing or bathing?: No Does the patient have difficulty doing errands alone such as visiting a doctor's office or shopping?: No  Depression History of depression and anxiety.  Doing well with Fluoxetine.   Depression screen [facility] 2/9 02/12/2017 02/09/2016 03/16/2015  Decreased Interest 0 0 3  Down, Depressed, Hopeless 0 0 3  PHQ - 2 Score 0 0 6  Altered sleeping 0 - 0  Tired, decreased energy 0 - 1  Change in appetite 0 - 0  Feeling bad or failure about yourself  0 - 3  Trouble concentrating 0 - 2  Moving slowly or fidgety/restless 0 - 0  Suicidal thoughts 0 - 0  PHQ-9 Score 0 - 12  Difficult doing work/chores - - Very difficult   Fall Risk  02/12/2017 02/09/2016  Falls in the past year? No No   Social History   Social History  . Marital status: Married    Spouse name: N/A  . Number of children: N/A  . Years of education: N/A   Occupational History  . Not on file.   Social History Main Topics  . Smoking status: Current Every Day Smoker    Packs/day: 0.50    Types: Cigarettes  . Smokeless tobacco: Never Used  . Alcohol use No  . Drug use: No  . Sexual activity: Yes   Other Topics Concern  . Not on file   Social History Narrative  . No narrative on file   Family  History  Problem Relation Age of Onset  . Dementia Mother   . Thyroid disease Mother   . Hypertension Mother   . Arthritis Mother   . Diabetes Mother   . Diabetes Father    Past Medical History:  Diagnosis Date  . Anxiety   . Depression   . Menopause    Past Surgical History:  Procedure Laterality Date  . CHOLECYSTECTOMY    . ovarian cyst rupture      Relevant past medical, surgical, family and social history reviewed and updated as indicated. Interim medical history since our last visit reviewed. Allergies and medications reviewed and updated.  Review of Systems  Constitutional: Negative.   HENT: Positive for hearing loss.   Eyes: Negative.   Respiratory: Negative.   Cardiovascular: Negative.   Gastrointestinal: Negative.   Endocrine: Negative.   Genitourinary: Negative.   Musculoskeletal: Negative.   Skin: Negative.   Allergic/Immunologic: Negative.   Neurological: Negative.   Hematological: Negative.   Psychiatric/Behavioral: Negative.     Per HPI unless specifically indicated above     Objective:    BP 129/81   Pulse (!) 106   Temp 98.6 F (37 C)  Ht 5' 0.2" (1.529 m)   Wt 137 lb 12.8 oz (62.5 kg)   LMP  (LMP Unknown)   SpO2 98%   BMI 26.73 kg/m   Wt Readings from Last 3 Encounters:  02/12/17 137 lb 12.8 oz (62.5 kg)  02/09/16 141 lb 12.8 oz (64.3 kg)  03/16/15 139 lb 12.8 oz (63.4 kg)    Physical Exam  Constitutional: She is oriented to person, place, and time. She appears well-developed and well-nourished. No distress.  HENT:  Head: Normocephalic and atraumatic.  Eyes: Conjunctivae and lids are normal. Right eye exhibits no discharge. Left eye exhibits no discharge. No scleral icterus.  Neck: Normal range of motion. Neck supple. No JVD present. Carotid bruit is not present.  Cardiovascular: Normal rate, regular rhythm and normal heart sounds.   Pulmonary/Chest: Effort normal and breath sounds normal.  Abdominal: Normal appearance. There is  no splenomegaly or hepatomegaly.  Musculoskeletal: Normal range of motion.  Neurological: She is alert and oriented to person, place, and time.  Skin: Skin is warm, dry and intact. No rash noted. No pallor.  Psychiatric: She has a normal mood and affect. Her behavior is normal. Judgment and thought content normal.    Results for orders placed or performed in visit on 02/09/16  Hepatitis C antibody  Result Value Ref Range   Hep C Virus Ab <0.1 0.0 - 0.9 s/co ratio  CBC with Differential/Platelet  Result Value Ref Range   WBC 5.9 3.4 - 10.8 x10E3/uL   RBC 4.80 3.77 - 5.28 x10E6/uL   Hemoglobin 15.8 11.1 - 15.9 g/dL   Hematocrit 40.9 81.1 - 46.6 %   MCV 95 79 - 97 fL   MCH 32.9 26.6 - 33.0 pg   MCHC 34.6 31.5 - 35.7 g/dL   RDW 91.4 78.2 - 95.6 %   Platelets 206 150 - 379 x10E3/uL   Neutrophils 59 Not Estab. %   Lymphs 31 Not Estab. %   Monocytes 7 Not Estab. %   Eos 2 Not Estab. %   Basos 1 Not Estab. %   Neutrophils Absolute 3.5 1.4 - 7.0 x10E3/uL   Lymphocytes Absolute 1.8 0.7 - 3.1 x10E3/uL   Monocytes Absolute 0.4 0.1 - 0.9 x10E3/uL   EOS (ABSOLUTE) 0.1 0.0 - 0.4 x10E3/uL   Basophils Absolute 0.0 0.0 - 0.2 x10E3/uL   Immature Granulocytes 0 Not Estab. %   Immature Grans (Abs) 0.0 0.0 - 0.1 x10E3/uL  Comprehensive metabolic panel  Result Value Ref Range   Glucose 82 65 - 99 mg/dL   BUN 10 8 - 27 mg/dL   Creatinine, Ser 2.13 0.57 - 1.00 mg/dL   GFR calc non Af Amer 79 >59 mL/min/1.73   GFR calc Af Amer 91 >59 mL/min/1.73   BUN/Creatinine Ratio 13 12 - 28   Sodium 141 134 - 144 mmol/L   Potassium 5.1 3.5 - 5.2 mmol/L   Chloride 103 96 - 106 mmol/L   CO2 25 18 - 29 mmol/L   Calcium 10.0 8.7 - 10.3 mg/dL   Total Protein 6.8 6.0 - 8.5 g/dL   Albumin 4.1 3.6 - 4.8 g/dL   Globulin, Total 2.7 1.5 - 4.5 g/dL   Albumin/Globulin Ratio 1.5 1.2 - 2.2   Bilirubin Total 0.3 0.0 - 1.2 mg/dL   Alkaline Phosphatase 80 39 - 117 IU/L   AST 14 0 - 40 IU/L   ALT 14 0 - 32 IU/L  Lipid  Panel w/o Chol/HDL Ratio  Result Value Ref Range  Cholesterol, Total 205 (H) 100 - 199 mg/dL   Triglycerides 161 0 - 149 mg/dL   HDL 54 >09 mg/dL   VLDL Cholesterol Cal 28 5 - 40 mg/dL   LDL Calculated 604 (H) 0 - 99 mg/dL  TSH  Result Value Ref Range   TSH 1.030 0.450 - 4.500 uIU/mL  VITAMIN D 25 Hydroxy (Vit-D Deficiency, Fractures)  Result Value Ref Range   Vit D, 25-Hydroxy 36.0 30.0 - 100.0 ng/mL      Assessment & Plan:   Problem List Items Addressed This Visit      Unprioritized   Advance care planning    A voluntary discussion about advance care planning including the explanation and discussion of advance directives was extensively discussed  with the patient.  Explanation about the health care proxy and Living will was reviewed and packet with forms with explanation of how to fill them out was given.  During this discussion, the patient was able to identify a health care proxy as her husbane and plans to fill out the paperwork required.  Patient was offered a separate Advance Care Planning visit for further assistance with forms.         Depression    Stable, continue present medications.        Hearing loss    Refer to ENT for further evaluation      Relevant Orders   Ambulatory referral to ENT   Tobacco abuse     I have recommended absolute tobacco cessation. I have discussed various options available for assistance with tobacco cessation including over the counter methods (Nicotine gum, patch and lozenges). We also discussed prescription options (Chantix, Nicotine Inhaler / Nasal Spray). The patient is not interested in pursuing any prescription tobacco cessation options at this time.        Other Visit Diagnoses    Medicare annual wellness visit, initial       Relevant Orders   EKG 12-Lead (Completed)   Ovarian failure       Relevant Orders   DG Bone Density   Visit for screening mammogram       Relevant Orders   MM DIGITAL SCREENING BILATERAL    Screening cholesterol level       Relevant Orders   Lipid Panel w/o Chol/HDL Ratio   Medication monitoring encounter       Relevant Orders   Comprehensive metabolic panel       Follow up plan: Return in about 1 year (around 02/12/2018).

## 2017-02-12 NOTE — Assessment & Plan Note (Signed)
A voluntary discussion about advance care planning including the explanation and discussion of advance directives was extensively discussed  with the patient.  Explanation about the health care proxy and Living will was reviewed and packet with forms with explanation of how to fill them out was given.  During this discussion, the patient was able to identify a health care proxy as her husbane and plans to fill out the paperwork required.  Patient was offered a separate Advance Care Planning visit for further assistance with forms.

## 2017-02-12 NOTE — Patient Instructions (Addendum)
Please do call to schedule your mammogram and bone density; the number to schedule one at either [facility] or [facility] is [phone]   

## 2017-02-12 NOTE — Assessment & Plan Note (Signed)
I have recommended absolute tobacco cessation. I have discussed various options available for assistance with tobacco cessation including over the counter methods (Nicotine gum, patch and lozenges). We also discussed prescription options (Chantix, Nicotine Inhaler / Nasal Spray). The patient is not interested in pursuing any prescription tobacco cessation options at this time.  

## 2017-02-13 LAB — COMPREHENSIVE METABOLIC PANEL
ALT: 14 IU/L (ref 0–32)
AST: 17 IU/L (ref 0–40)
Albumin/Globulin Ratio: 1.5 (ref 1.2–2.2)
Albumin: 4.1 g/dL (ref 3.6–4.8)
Alkaline Phosphatase: 78 IU/L (ref 39–117)
BUN/Creatinine Ratio: 10 — ABNORMAL LOW (ref 12–28)
BUN: 8 mg/dL (ref 8–27)
Bilirubin Total: 0.2 mg/dL (ref 0.0–1.2)
CO2: 26 mmol/L (ref 20–29)
Calcium: 10.1 mg/dL (ref 8.7–10.3)
Chloride: 101 mmol/L (ref 96–106)
Creatinine, Ser: 0.83 mg/dL (ref 0.57–1.00)
GFR calc Af Amer: 86 mL/min/{1.73_m2} (ref >59)
GFR calc non Af Amer: 74 mL/min/{1.73_m2} (ref >59)
Globulin, Total: 2.7 g/dL (ref 1.5–4.5)
Glucose: 87 mg/dL (ref 65–99)
Potassium: 5.2 mmol/L (ref 3.5–5.2)
Sodium: 141 mmol/L (ref 134–144)
Total Protein: 6.8 g/dL (ref 6.0–8.5)

## 2017-02-13 LAB — LIPID PANEL W/O CHOL/HDL RATIO
Cholesterol, Total: 210 mg/dL — ABNORMAL HIGH (ref 100–199)
HDL: 52 mg/dL (ref >39)
LDL Calculated: 120 mg/dL — ABNORMAL HIGH (ref 0–99)
Triglycerides: 190 mg/dL — ABNORMAL HIGH (ref 0–149)
VLDL Cholesterol Cal: 38 mg/dL (ref 5–40)

## 2017-04-10 ENCOUNTER — Other Ambulatory Visit: Payer: Self-pay

## 2017-04-10 MED ORDER — NONFORMULARY OR COMPOUNDED ITEM: 125.0000 mg | Freq: Every day | 3 refills | Status: DC

## 2017-04-10 NOTE — Telephone Encounter (Signed)
Patient last seen 02/12/17

## 2018-01-21 ENCOUNTER — Telehealth: Payer: Self-pay | Admitting: Unknown Physician Specialty

## 2018-01-21 NOTE — Telephone Encounter (Signed)
Called patient in regards to crm to reschedule appointment no answer left voicemail

## 2018-02-05 ENCOUNTER — Other Ambulatory Visit: Payer: Self-pay | Admitting: Unknown Physician Specialty

## 2018-02-05 NOTE — Telephone Encounter (Signed)
Requested Prescriptions  Pending Prescriptions Disp Refills  . amitriptyline (ELAVIL) 25 MG tablet [Pharmacy Med Name: AMITRIPTYLINE HCL 25 MG TAB] 60 tablet 0    Sig: Take 2 tablets (50 mg total) by mouth at bedtime.     Psychiatry:  Antidepressants - Heterocyclics (TCAs) Failed - 02/05/2018 10:22 AM      Failed - Valid encounter within last 6 months    Recent Outpatient Visits          11 months ago Medicare annual wellness visit, initial   [facility] Gabriel Cirri, NP   1 year ago Annual physical exam   [facility] Gabriel Cirri, NP   2 years ago Depression   [facility] Gabriel Cirri, NP   3 years ago Depression   [facility] Gabriel Cirri, NP      Future Appointments            In 2 weeks Cannady, Dorie Rank, NP [facility]           Passed - Completed PHQ-2 or PHQ-9 in the last 360 days.    One month courtesy given.

## 2018-02-15 ENCOUNTER — Encounter: Payer: Medicare Other | Admitting: Unknown Physician Specialty

## 2018-02-19 ENCOUNTER — Ambulatory Visit (INDEPENDENT_AMBULATORY_CARE_PROVIDER_SITE_OTHER): Payer: Medicare Other | Admitting: Nurse Practitioner

## 2018-02-19 ENCOUNTER — Encounter: Payer: Self-pay | Admitting: Nurse Practitioner

## 2018-02-19 VITALS — BP 122/83 | HR 88 | Temp 98.9°F | Ht 59.45 in | Wt 139.5 lb

## 2018-02-19 DIAGNOSIS — Z0000 Encounter for general adult medical examination without abnormal findings: Secondary | ICD-10-CM

## 2018-02-19 DIAGNOSIS — F3341 Major depressive disorder, recurrent, in partial remission: Secondary | ICD-10-CM | POA: Diagnosis not present

## 2018-02-19 DIAGNOSIS — Z780 Asymptomatic menopausal state: Secondary | ICD-10-CM

## 2018-02-19 DIAGNOSIS — K582 Mixed irritable bowel syndrome: Secondary | ICD-10-CM

## 2018-02-19 MED ORDER — NONFORMULARY OR COMPOUNDED ITEM: 125.0000 mg | Freq: Every day | 3 refills | Status: DC

## 2018-02-19 NOTE — Assessment & Plan Note (Signed)
Chronic, stable on current medication regimen (compounded Progesterone 125MG  daily).  Continue current regimen.  Script to be faxed to pharmacy in [place].

## 2018-02-19 NOTE — Assessment & Plan Note (Signed)
Chronic, stable on current medication regimen.  Continue current regimen. 

## 2018-02-19 NOTE — Patient Instructions (Signed)
Menopause Menopause is the normal time of life when menstrual periods stop completely. Menopause is complete when you have missed 12 consecutive menstrual periods. It usually occurs between the ages of 48 years and 55 years. Very rarely does a woman develop menopause before the age of 40 years. At menopause, your ovaries stop producing the female hormones estrogen and progesterone. This can cause undesirable symptoms and also affect your health. Sometimes the symptoms may occur 4-5 years before the menopause begins. There is no relationship between menopause and:  Oral contraceptives.  Number of children you had.  Race.  The age your menstrual periods started (menarche).  Heavy smokers and very thin women may develop menopause earlier in life. What are the causes?  The ovaries stop producing the female hormones estrogen and progesterone. Other causes include:  Surgery to remove both ovaries.  The ovaries stop functioning for no known reason.  Tumors of the pituitary gland in the brain.  Medical disease that affects the ovaries and hormone production.  Radiation treatment to the abdomen or pelvis.  Chemotherapy that affects the ovaries.  What are the signs or symptoms?  Hot flashes.  Night sweats.  Decrease in sex drive.  Vaginal dryness and thinning of the vagina causing painful intercourse.  Dryness of the skin and developing wrinkles.  Headaches.  Tiredness.  Irritability.  Memory problems.  Weight gain.  Bladder infections.  Hair growth of the face and chest.  Infertility. More serious symptoms include:  Loss of bone (osteoporosis) causing breaks (fractures).  Depression.  Hardening and narrowing of the arteries (atherosclerosis) causing heart attacks and strokes.  How is this diagnosed?  When the menstrual periods have stopped for 12 straight months.  Physical exam.  Hormone studies of the blood. How is this treated? There are many treatment  choices and nearly as many questions about them. The decisions to treat or not to treat menopausal changes is an individual choice made with your health care provider. Your health care provider can discuss the treatments with you. Together, you can decide which treatment will work best for you. Your treatment choices may include:  Hormone therapy (estrogen and progesterone).  Non-hormonal medicines.  Treating the individual symptoms with medicine (for example antidepressants for depression).  Herbal medicines that may help specific symptoms.  Counseling by a psychiatrist or psychologist.  Group therapy.  Lifestyle changes including: ? Eating healthy. ? Regular exercise. ? Limiting caffeine and alcohol. ? Stress management and meditation.  No treatment.  Follow these instructions at home:  Take the medicine your health care provider gives you as directed.  Get plenty of sleep and rest.  Exercise regularly.  Eat a diet that contains calcium (good for the bones) and soy products (acts like estrogen hormone).  Avoid alcoholic beverages.  Do not smoke.  If you have hot flashes, dress in layers.  Take supplements, calcium, and vitamin D to strengthen bones.  You can use over-the-counter lubricants or moisturizers for vaginal dryness.  Group therapy is sometimes very helpful.  Acupuncture may be helpful in some cases. Contact a health care provider if:  You are not sure you are in menopause.  You are having menopausal symptoms and need advice and treatment.  You are still having menstrual periods after age 55 years.  You have pain with intercourse.  Menopause is complete (no menstrual period for 12 months) and you develop vaginal bleeding.  You need a referral to a specialist (gynecologist, psychiatrist, or psychologist) for treatment. Get help right   away if:  You have severe depression.  You have excessive vaginal bleeding.  You fell and think you have a  broken bone.  You have pain when you urinate.  You develop leg or chest pain.  You have a fast pounding heart beat (palpitations).  You have severe headaches.  You develop vision problems.  You feel a lump in your breast.  You have abdominal pain or severe indigestion. This information is not intended to replace advice given to you by your health care provider. Make sure you discuss any questions you have with your health care provider. Document Released: 07/15/2003 Document Revised: 09/30/2015 Document Reviewed: 11/21/2012 Elsevier Interactive Patient Education  2017 [employer]  

## 2018-02-19 NOTE — Assessment & Plan Note (Signed)
Chronic, stable on current bowel regimen.  Uses Miralax on occasion at home for constipation.  Minimal use of Levbid.

## 2018-02-19 NOTE — Progress Notes (Signed)
BP 122/83 (BP Location: Left Arm, Patient Position: Sitting, Cuff Size: Normal)   Pulse 88   Temp 98.9 F (37.2 C)   Ht 4' 11.45" (1.51 m)   Wt 139 lb 8 oz (63.3 kg)   LMP  (LMP Unknown)   SpO2 100%   BMI 27.75 kg/m    Subjective:    Patient ID: Priscilla [name], female    DOB: 04-16-1951, 67 y.o.   MRN: [id number]  HPI: Priscilla [name] is a 67 y.o. female presents for annual physical exam  No chief complaint on file.  DEPRESSION Mood status: controlled Satisfied with current treatment?: yes Symptom severity: mild  Duration of current treatment : chronic Side effects: no Medication compliance: excellent compliance Psychotherapy/counseling: no none Previous psychiatric medications: does not recall Depressed mood: no Anxious mood: no Anhedonia: no Significant weight loss or gain: no Insomnia: no no issues Fatigue: no Feelings of worthlessness or guilt: no Impaired concentration/indecisiveness: no Suicidal ideations: no Hopelessness: no Crying spells: no  Depression screen [facility] 2/9 02/19/2018 02/12/2017 02/09/2016  Decreased Interest 0 0 0  Down, Depressed, Hopeless 0 0 0  PHQ - 2 Score 0 0 0  Altered sleeping 0 0 -  Tired, decreased energy 0 0 -  Change in appetite 0 0 -  Feeling bad or failure about yourself  0 0 -  Trouble concentrating 0 0 -  Moving slowly or fidgety/restless 0 0 -  Suicidal thoughts 0 0 -  PHQ-9 Score 0 0 -  Difficult doing work/chores Not difficult at all - -    MENOPAUSAL SYMPTOMS Gravida/Para: Duration: controlled Symptom severity: no issues, currently controlled on medication Hot flashes: no Night sweats: no Sleep disturbances: no Vaginal dryness: no Dyspareunia:no Decreased libido: no Emotional lability: no Stress incontinence: no Previous HRT/pharmacotherapy: yes Hysterectomy: yes Average interval between menses:  Length of menses:  Flow: Dysmenorrhea:  GYN surgery:  Absolute Contraindications to Hormonal Therapy:   Undiagnosed vaginal bleeding: no    Breast cancer: no    Endometrial cancer: no    Coronary disease: no    Cerebrovascular disease: no    Venous thromboembolic disease: no   Irritable Bowel Syndrome: Does have Hyoscyamine which she rarely takes.  Reports adequate control with minimal medication.  Uses Metamucil as needed.  She reports more issues with constipation than diarrhea, but reports good control on her current regimen.    Relevant past medical, surgical, family and social history reviewed and updated as indicated. Interim medical history since our last visit reviewed. Allergies and medications reviewed and updated.  Review of Systems  Constitutional: Negative for activity change, appetite change and fatigue.  HENT: Negative.   Eyes: Negative.   Respiratory: Negative for cough, chest tightness and shortness of breath.   Cardiovascular: Negative for chest pain, palpitations and leg swelling.  Gastrointestinal: Negative for abdominal distention, abdominal pain, constipation, diarrhea, nausea and vomiting.  Endocrine: Negative.   Genitourinary: Negative.   Musculoskeletal: Negative.   Skin: Negative.   Allergic/Immunologic: Negative.   Neurological: Negative for dizziness, weakness, numbness and headaches.  Hematological: Negative.   Psychiatric/Behavioral: Negative.    Per HPI unless specifically indicated above     Objective:    BP 122/83 (BP Location: Left Arm, Patient Position: Sitting, Cuff Size: Normal)   Pulse 88   Temp 98.9 F (37.2 C)   Ht 4' 11.45" (1.51 m)   Wt 139 lb 8 oz (63.3 kg)   LMP  (LMP Unknown)   SpO2 100%  BMI 27.75 kg/m   Wt Readings from Last 3 Encounters:  02/19/18 139 lb 8 oz (63.3 kg)  02/12/17 137 lb 12.8 oz (62.5 kg)  02/09/16 141 lb 12.8 oz (64.3 kg)    Physical Exam  Constitutional: She is oriented to person, place, and time. She appears well-developed and well-nourished.  HENT:  Head: Normocephalic and atraumatic.  Right Ear:  External ear normal.  Left Ear: External ear normal.  Nose: Nose normal.  Mouth/Throat: Oropharynx is clear and moist.  Eyes: Pupils are equal, round, and reactive to light. Conjunctivae and EOM are normal.  Neck: Normal range of motion. Neck supple.  Cardiovascular: Normal rate, regular rhythm, normal heart sounds and intact distal pulses.  Pulmonary/Chest: Effort normal and breath sounds normal.  Abdominal: Soft. Bowel sounds are normal.  Musculoskeletal: Normal range of motion.  Neurological: She is alert and oriented to person, place, and time.  Skin: Skin is warm and dry.  Psychiatric: She has a normal mood and affect. Her behavior is normal. Judgment and thought content normal.  Breast exam deferred per patient request, she wishes to obtain mammogram.  Results for orders placed or performed in visit on 02/12/17  Lipid Panel w/o Chol/HDL Ratio  Result Value Ref Range   Cholesterol, Total 210 (H) 100 - 199 mg/dL   Triglycerides 190 (H) 0 - 149 mg/dL   HDL 52 >39 mg/dL   VLDL Cholesterol Cal 38 5 - 40 mg/dL   LDL Calculated 120 (H) 0 - 99 mg/dL  Comprehensive metabolic panel  Result Value Ref Range   Glucose 87 65 - 99 mg/dL   BUN 8 8 - 27 mg/dL   Creatinine, Ser 0.83 0.57 - 1.00 mg/dL   GFR calc non Af Amer 74 >59 mL/min/1.73   GFR calc Af Amer 86 >59 mL/min/1.73   BUN/Creatinine Ratio 10 (L) 12 - 28   Sodium 141 134 - 144 mmol/L   Potassium 5.2 3.5 - 5.2 mmol/L   Chloride 101 96 - 106 mmol/L   CO2 26 20 - 29 mmol/L   Calcium 10.1 8.7 - 10.3 mg/dL   Total Protein 6.8 6.0 - 8.5 g/dL   Albumin 4.1 3.6 - 4.8 g/dL   Globulin, Total 2.7 1.5 - 4.5 g/dL   Albumin/Globulin Ratio 1.5 1.2 - 2.2   Bilirubin Total 0.2 0.0 - 1.2 mg/dL   Alkaline Phosphatase 78 39 - 117 IU/L   AST 17 0 - 40 IU/L   ALT 14 0 - 32 IU/L      Assessment & Plan:   Problem List Items Addressed This Visit      Digestive   IBS (irritable bowel syndrome)    Chronic, stable on current bowel regimen.   Uses Miralax on occasion at home for constipation.  Minimal use of Levbid.        Other   Menopause    Chronic, stable on current medication regimen (compounded Progesterone 125MG daily).  Continue current regimen.  Script to be faxed to pharmacy in [place].      Depression    Chronic, stable on current medication regimen.  Continue current regimen.       Other Visit Diagnoses    Annual physical exam    -  Primary   Annual labs drawn.  Refuses flu shot or PCV13.   Relevant Orders   MM Digital Screening   Comp Met (CMET)   CBC w/Diff   TSH   Lipid Profile   Vitamin D (25 hydroxy)  Follow up plan: Return in about 1 year (around 02/20/2019), or if symptoms worsen or fail to improve, for annual exam.

## 2018-02-20 LAB — CBC WITH DIFFERENTIAL/PLATELET
BASOS: 1 %
Basophils Absolute: 0.1 10*3/uL (ref 0.0–0.2)
EOS (ABSOLUTE): 0.2 10*3/uL (ref 0.0–0.4)
EOS: 3 %
HEMATOCRIT: 44.9 % (ref 34.0–46.6)
Hemoglobin: 14.8 g/dL (ref 11.1–15.9)
IMMATURE GRANULOCYTES: 0 %
Immature Grans (Abs): 0 10*3/uL (ref 0.0–0.1)
LYMPHS ABS: 1.1 10*3/uL (ref 0.7–3.1)
Lymphs: 23 %
MCH: 32.1 pg (ref 26.6–33.0)
MCHC: 33 g/dL (ref 31.5–35.7)
MCV: 97 fL (ref 79–97)
MONOS ABS: 0.4 10*3/uL (ref 0.1–0.9)
Monocytes: 8 %
NEUTROS ABS: 3.2 10*3/uL (ref 1.4–7.0)
NEUTROS PCT: 65 %
Platelets: 196 10*3/uL (ref 150–450)
RBC: 4.61 x10E6/uL (ref 3.77–5.28)
RDW: 12.3 % (ref 12.3–15.4)
WBC: 5 10*3/uL (ref 3.4–10.8)

## 2018-02-20 LAB — LIPID PANEL
CHOL/HDL RATIO: 4.9 ratio — AB (ref 0.0–4.4)
Cholesterol, Total: 234 mg/dL — ABNORMAL HIGH (ref 100–199)
HDL: 48 mg/dL (ref >39)
LDL Calculated: 149 mg/dL — ABNORMAL HIGH (ref 0–99)
Triglycerides: 185 mg/dL — ABNORMAL HIGH (ref 0–149)
VLDL Cholesterol Cal: 37 mg/dL (ref 5–40)

## 2018-02-20 LAB — COMPREHENSIVE METABOLIC PANEL
ALBUMIN: 4.5 g/dL (ref 3.6–4.8)
ALT: 13 IU/L (ref 0–32)
AST: 9 IU/L (ref 0–40)
Albumin/Globulin Ratio: 1.7 (ref 1.2–2.2)
Alkaline Phosphatase: 70 IU/L (ref 39–117)
BUN / CREAT RATIO: 14 (ref 12–28)
BUN: 10 mg/dL (ref 8–27)
Bilirubin Total: 0.3 mg/dL (ref 0.0–1.2)
CALCIUM: 10.4 mg/dL — AB (ref 8.7–10.3)
CO2: 25 mmol/L (ref 20–29)
CREATININE: 0.71 mg/dL (ref 0.57–1.00)
Chloride: 101 mmol/L (ref 96–106)
GFR, EST AFRICAN AMERICAN: 103 mL/min/{1.73_m2} (ref >59)
GFR, EST NON AFRICAN AMERICAN: 89 mL/min/{1.73_m2} (ref >59)
GLOBULIN, TOTAL: 2.6 g/dL (ref 1.5–4.5)
Glucose: 97 mg/dL (ref 65–99)
Potassium: 4.4 mmol/L (ref 3.5–5.2)
SODIUM: 141 mmol/L (ref 134–144)
TOTAL PROTEIN: 7.1 g/dL (ref 6.0–8.5)

## 2018-02-20 LAB — TSH: TSH: 3.24 u[IU]/mL (ref 0.450–4.500)

## 2018-02-20 LAB — VITAMIN D 25 HYDROXY (VIT D DEFICIENCY, FRACTURES): VIT D 25 HYDROXY: 45.5 ng/mL (ref 30.0–100.0)

## 2018-03-06 ENCOUNTER — Other Ambulatory Visit: Payer: Self-pay

## 2018-03-06 MED ORDER — AMITRIPTYLINE HCL 25 MG PO TABS: 50.0000 mg | ORAL_TABLET | Freq: Every day | ORAL | 0 refills | Status: DC

## 2018-03-06 NOTE — Telephone Encounter (Signed)
Pharmacy requesting 90 day RX. Patient last seen 02/19/18.

## 2018-03-06 NOTE — Telephone Encounter (Signed)
Refill request approved for 90 day supply.

## 2018-03-21 ENCOUNTER — Other Ambulatory Visit: Payer: Self-pay

## 2018-03-21 MED ORDER — AMITRIPTYLINE HCL 25 MG PO TABS: 50.0000 mg | ORAL_TABLET | Freq: Every day | ORAL | 0 refills | Status: DC

## 2018-03-21 NOTE — Telephone Encounter (Signed)
Pt requesting 90 day supply

## 2018-03-21 NOTE — Telephone Encounter (Signed)
Refill request for 90 day supply approved and sent.

## 2018-05-29 ENCOUNTER — Other Ambulatory Visit: Payer: Self-pay | Admitting: Nurse Practitioner

## 2018-08-08 ENCOUNTER — Other Ambulatory Visit: Payer: Self-pay | Admitting: Unknown Physician Specialty

## 2018-08-08 NOTE — Telephone Encounter (Signed)
Requested Prescriptions  Pending Prescriptions Disp Refills  . hyoscyamine (LEVBID) 0.375 MG 12 hr tablet [Pharmacy Med Name: HYOSCYAMINE ER 0.375 MG TAB] 60 tablet 0    Sig: Take 1 tablet (0.375 mg total) by mouth every 12 (twelve) hours as needed.     Gastroenterology:  Antispasmodic Agents Passed - 08/08/2018  3:13 PM      Passed - Last Heart Rate in normal range    Pulse Readings from Last 1 Encounters:  02/19/18 88         Passed - Valid encounter within last 12 months    Recent Outpatient Visits          5 months ago Annual physical exam   [facility] Aura Dials T, NP   1 year ago Medicare annual wellness visit, initial   [facility] Gabriel Cirri, NP   2 years ago Annual physical exam   [facility] Gabriel Cirri, NP   3 years ago Depression   [facility] Gabriel Cirri, NP   3 years ago Depression   [facility] Gabriel Cirri, NP      Future Appointments            In 6 months Cannady, Dorie Rank, NP [employer] [facility]

## 2018-11-15 ENCOUNTER — Other Ambulatory Visit: Payer: Self-pay | Admitting: Nurse Practitioner

## 2018-11-15 NOTE — Telephone Encounter (Signed)
Please advise 

## 2019-02-21 ENCOUNTER — Encounter: Payer: Medicare Other | Admitting: Nurse Practitioner

## 2019-03-05 ENCOUNTER — Other Ambulatory Visit: Payer: Self-pay

## 2019-03-05 ENCOUNTER — Encounter: Payer: Self-pay | Admitting: Nurse Practitioner

## 2019-03-05 ENCOUNTER — Ambulatory Visit (INDEPENDENT_AMBULATORY_CARE_PROVIDER_SITE_OTHER): Payer: Medicare Other | Admitting: Nurse Practitioner

## 2019-03-05 VITALS — BP 132/84 | HR 83 | Temp 98.9°F

## 2019-03-05 DIAGNOSIS — N6001 Solitary cyst of right breast: Secondary | ICD-10-CM | POA: Diagnosis not present

## 2019-03-05 DIAGNOSIS — N6002 Solitary cyst of left breast: Secondary | ICD-10-CM | POA: Insufficient documentation

## 2019-03-05 DIAGNOSIS — Z1231 Encounter for screening mammogram for malignant neoplasm of breast: Secondary | ICD-10-CM | POA: Diagnosis not present

## 2019-03-05 MED ORDER — DOXYCYCLINE HYCLATE 100 MG PO TABS: 100.0000 mg | ORAL_TABLET | Freq: Two times a day (BID) | ORAL | 0 refills | Status: DC

## 2019-03-05 NOTE — Progress Notes (Signed)
BP 132/84   Pulse 83   Temp 98.9 F (37.2 C) (Oral)   LMP  (LMP Unknown)   SpO2 100%    Subjective:    Patient ID: Priscilla [name], female    DOB: [birth year], 68 y.o.   MRN: [id number]  HPI: Priscilla [name] is a 68 y.o. female  Chief Complaint  Patient presents with  . Cyst    pt states she has a red spot that came up on Sunday right above her left breast, States it is painful and sore to the touch    SKIN INFECTION Started on Sunday.  Reports there has been a little knot left breast that has been there a long time and then Sunday it flared up with redness and tenderness.  Also has one to inner right breast.  Reports they have marked these during past mammograms and she was told to monitor, unless flare or irritation present.  No recent mammogram noted in chart. Duration: days Location: left upper breast History of trauma in area: no Pain: yes Quality: yes Severity: 3/10 Redness: yes Swelling: yes Oozing: no Pus: no Fevers: no Nausea/vomiting: no Status: stable Treatments attempted:none   Relevant past medical, surgical, family and social history reviewed and updated as indicated. Interim medical history since our last visit reviewed. Allergies and medications reviewed and updated.  Review of Systems  Constitutional: Negative for activity change, appetite change, diaphoresis, fatigue and fever.  Respiratory: Negative for cough, chest tightness and shortness of breath.   Cardiovascular: Negative for chest pain, palpitations and leg swelling.  Gastrointestinal: Negative for abdominal distention, abdominal pain, constipation, diarrhea, nausea and vomiting.  Psychiatric/Behavioral: Negative.     Per HPI unless specifically indicated above     Objective:    BP 132/84   Pulse 83   Temp 98.9 F (37.2 C) (Oral)   LMP  (LMP Unknown)   SpO2 100%   Wt Readings from Last 3 Encounters:  02/19/18 139 lb 8 oz (63.3 kg)  02/12/17 137 lb 12.8 oz (62.5 kg)  02/09/16 141 lb  12.8 oz (64.3 kg)    Physical Exam Vitals signs and nursing note reviewed.  Constitutional:      General: She is awake. She is not in acute distress.    Appearance: She is well-developed. She is not ill-appearing.  HENT:     Head: Normocephalic.     Right Ear: Hearing normal.     Left Ear: Hearing normal.  Eyes:     General: Lids are normal.        Right eye: No discharge.        Left eye: No discharge.     Conjunctiva/sclera: Conjunctivae normal.     Pupils: Pupils are equal, round, and reactive to light.  Neck:     Musculoskeletal: Normal range of motion and neck supple.     Vascular: No carotid bruit.  Cardiovascular:     Rate and Rhythm: Normal rate and regular rhythm.     Heart sounds: Normal heart sounds. No murmur. No gallop.   Pulmonary:     Effort: Pulmonary effort is normal. No accessory muscle usage or respiratory distress.     Breath sounds: Normal breath sounds.  Chest:       Comments: Right breast, at approx 3 o'clock, small (approx 2 cm) mass, mobile, round, defined edges.  No erythema, warmth, tenderness to exterior.  Breast with no nipple or skin changes noted. Abdominal:     General: Bowel sounds  are normal.     Palpations: Abdomen is soft.  Musculoskeletal:     Right lower leg: No edema.     Left lower leg: No edema.  Lymphadenopathy:     Cervical: No cervical adenopathy.     Upper Body:     Right upper body: No supraclavicular, axillary or pectoral adenopathy.     Left upper body: No supraclavicular, axillary or pectoral adenopathy.  Skin:    General: Skin is warm and dry.  Neurological:     Mental Status: She is alert and oriented to person, place, and time.  Psychiatric:        Attention and Perception: Attention normal.        Mood and Affect: Mood normal.        Behavior: Behavior normal. Behavior is cooperative.        Thought Content: Thought content normal.        Judgment: Judgment normal.     Results for orders placed or performed in  visit on 02/19/18  Comp Met (CMET)  Result Value Ref Range   Glucose 97 65 - 99 mg/dL   BUN 10 8 - 27 mg/dL   Creatinine, Ser 0.71 0.57 - 1.00 mg/dL   GFR calc non Af Amer 89 >59 mL/min/1.73   GFR calc Af Amer 103 >59 mL/min/1.73   BUN/Creatinine Ratio 14 12 - 28   Sodium 141 134 - 144 mmol/L   Potassium 4.4 3.5 - 5.2 mmol/L   Chloride 101 96 - 106 mmol/L   CO2 25 20 - 29 mmol/L   Calcium 10.4 (H) 8.7 - 10.3 mg/dL   Total Protein 7.1 6.0 - 8.5 g/dL   Albumin 4.5 3.6 - 4.8 g/dL   Globulin, Total 2.6 1.5 - 4.5 g/dL   Albumin/Globulin Ratio 1.7 1.2 - 2.2   Bilirubin Total 0.3 0.0 - 1.2 mg/dL   Alkaline Phosphatase 70 39 - 117 IU/L   AST 9 0 - 40 IU/L   ALT 13 0 - 32 IU/L  CBC w/Diff  Result Value Ref Range   WBC 5.0 3.4 - 10.8 x10E3/uL   RBC 4.61 3.77 - 5.28 x10E6/uL   Hemoglobin 14.8 11.1 - 15.9 g/dL   Hematocrit 44.9 34.0 - 46.6 %   MCV 97 79 - 97 fL   MCH 32.1 26.6 - 33.0 pg   MCHC 33.0 31.5 - 35.7 g/dL   RDW 12.3 12.3 - 15.4 %   Platelets 196 150 - 450 x10E3/uL   Neutrophils 65 Not Estab. %   Lymphs 23 Not Estab. %   Monocytes 8 Not Estab. %   Eos 3 Not Estab. %   Basos 1 Not Estab. %   Neutrophils Absolute 3.2 1.4 - 7.0 x10E3/uL   Lymphocytes Absolute 1.1 0.7 - 3.1 x10E3/uL   Monocytes Absolute 0.4 0.1 - 0.9 x10E3/uL   EOS (ABSOLUTE) 0.2 0.0 - 0.4 x10E3/uL   Basophils Absolute 0.1 0.0 - 0.2 x10E3/uL   Immature Granulocytes 0 Not Estab. %   Immature Grans (Abs) 0.0 0.0 - 0.1 x10E3/uL  TSH  Result Value Ref Range   TSH 3.240 0.450 - 4.500 uIU/mL  Lipid Profile  Result Value Ref Range   Cholesterol, Total 234 (H) 100 - 199 mg/dL   Triglycerides 185 (H) 0 - 149 mg/dL   HDL 48 >39 mg/dL   VLDL Cholesterol Cal 37 5 - 40 mg/dL   LDL Calculated 149 (H) 0 - 99 mg/dL   Chol/HDL Ratio 4.9 (H) 0.0 -  4.4 ratio  Vitamin D (25 hydroxy)  Result Value Ref Range   Vit D, 25-Hydroxy 45.5 30.0 - 100.0 ng/mL      Assessment & Plan:   Problem List Items Addressed This Visit       Other   Cyst, breast solitary, left - Primary    Per patient present for years with recent irritation.  General surgery referral placed for further evaluation.  Script for Doxycycline sent due to cellulitis with warmth and erythema present to exterior.  Will also place mammogram order for assessment after she has seen general surgery, as it has been several years since her last mammogram.  Follow-up at upcoming physical.        Relevant Orders   Ambulatory referral to General Surgery   Cyst of right breast    Per patient present for years and noted on previous mammograms.  Will monitor area and place order for mammogram as she is past due.  Follow-up at upcoming physical.       Other Visit Diagnoses    Encounter for screening mammogram for malignant neoplasm of breast       Relevant Orders   MM DIGITAL SCREENING BILATERAL       Follow up plan: Return as scheduled in November.

## 2019-03-05 NOTE — Patient Instructions (Addendum)
[facility]  Address: [address], [place], [place] [address]  Phone: [phone]  Senna-S -- one tablet twice a day Colace -- 100 MG twice a day Probiotic --- take one tablet a day   Excision of Skin Lesions, Care After This sheet gives you information about how to care for yourself after your procedure. Your health care provider may also give you more specific instructions. If you have problems or questions, contact your health care provider. What can I expect after the procedure? After your procedure, it is common to have pain or discomfort at the excision site. Follow these instructions at home: Excision care   Follow instructions from your health care provider about how to take care of your excision site. Make sure you: ? Wash your hands with soap and water before and after you change your bandage (dressing). If soap and water are not available, use hand sanitizer. ? Change your dressing as told by your health care provider. ? Leave stitches (sutures), skin glue, or adhesive strips in place. These skin closures may need to stay in place for 2 weeks or longer. If adhesive strip edges start to loosen and curl up, you may trim the loose edges. Do not remove adhesive strips completely unless your health care provider tells you to do that.  Check the excision area every day for signs of infection. Watch for: ? Redness, swelling, or pain. ? Fluid or blood. ? Warmth. ? Pus or a bad smell.  Keep the site clean, dry, and protected for at least 48 hours.  For bleeding, apply gentle but firm pressure to the area using a folded towel for 20 minutes.  Avoid high-impact exercise and activities until the sutures are removed or the area heals. General instructions  Take over-the-counter and prescription medicines only as told by your health care provider.  Follow instructions from your health care provider about how to minimize scarring. Scarring  should lessen over time.  Avoid sun exposure until the area has healed. Use sunscreen to protect the area from the sun after it has healed.  Keep all follow-up visits as told by your health care provider. This is important. Contact a health care provider if:  You have redness, swelling, or pain around your excision site.  You have fluid or blood coming from your excision site.  Your excision site feels warm to the touch.  You have pus or a bad smell coming from your excision site.  You have a fever.  You have pain that does not improve in 2-3 days after your procedure.  You notice skin irregularities or changes in how you feel (sensation). Summary  This sheet of instructions provides you with information about caring for yourself after your procedure. Contact your health care provider if you have any problems or questions.  Take over-the-counter and prescription medicines only as told by your health care provider.  Change your dressing as told by your health care provider.  Contact a health care provider if you have redness, swelling, pain, or other signs of infection around your excision site.  Keep all follow-up visits as told by your health care provider. This is important. This information is not intended to replace advice given to you by your health care provider. Make sure you discuss any questions you have with your health care provider. Document Released: 09/08/2014 Document Revised: 10/31/2017 Document Reviewed: 10/31/2017 Elsevier Patient Education  2020 [employer].

## 2019-03-05 NOTE — Assessment & Plan Note (Signed)
Per patient present for years with recent irritation.  General surgery referral placed for further evaluation.  Script for Doxycycline sent due to cellulitis with warmth and erythema present to exterior.  Will also place mammogram order for assessment after she has seen general surgery, as it has been several years since her last mammogram.  Follow-up at upcoming physical.

## 2019-03-05 NOTE — Assessment & Plan Note (Signed)
Per patient present for years and noted on previous mammograms.  Will monitor area and place order for mammogram as she is past due.  Follow-up at upcoming physical.

## 2019-03-13 ENCOUNTER — Other Ambulatory Visit: Payer: Self-pay

## 2019-03-13 ENCOUNTER — Encounter: Payer: Self-pay | Admitting: General Surgery

## 2019-03-13 ENCOUNTER — Ambulatory Visit: Payer: Medicare Other | Admitting: General Surgery

## 2019-03-13 VITALS — BP 119/84 | HR 98 | Temp 97.3°F | Ht 60.0 in | Wt 144.4 lb

## 2019-03-13 DIAGNOSIS — L723 Sebaceous cyst: Secondary | ICD-10-CM | POA: Diagnosis not present

## 2019-03-13 DIAGNOSIS — L02213 Cutaneous abscess of chest wall: Secondary | ICD-10-CM

## 2019-03-13 DIAGNOSIS — N6082 Other benign mammary dysplasias of left breast: Secondary | ICD-10-CM | POA: Diagnosis not present

## 2019-03-13 MED ORDER — CEPHALEXIN 500 MG PO CAPS: 500.0000 mg | ORAL_CAPSULE | Freq: Four times a day (QID) | ORAL | 0 refills | Status: DC

## 2019-03-13 NOTE — Progress Notes (Signed)
[name] ID: Priscilla [name], female   DOB: 1950-12-25, 68 y.o.   MRN: [id number]  Chief Complaint  [name] presents with  . Cyst    L breast 02/27/19    HPI Priscilla [name] is a 68 y.o. female.   She has been referred by her primary care provider, Priscilla Dials, NP, for evaluation of a cyst on her left breast.  Priscilla [name] states that she has had cysts in both breasts for many years.  These have been marked on her mammograms but have not warranted any intervention.  Her last mammogram was approximately 7 years ago.  Priscilla [name] has ordered imaging, but it has not yet been performed.  Priscilla [name] states that approximately 2 weeks ago, Priscilla cyst in her upper left breast became red and inflamed.  It is painful.  It has not drained anything spontaneously.  She denies any fevers or chills.  No nausea or vomiting.  Priscilla pain is localized to Priscilla lesion and does not radiate anywhere.  She saw her primary care provider last week.  Doxycycline was prescribed, however Priscilla [name] states that it made her sick to her stomach and so she only took 1 pill.  Review of her breast history indicates that she has never had a prior breast biopsy.  She has been on a compounded progesterone supplement for approximately 10 years, since Priscilla time of her menopause.  She denies any family history of breast cancer.  Menarche was at Priscilla age of 14.  No pregnancies.  No discharge from her nipple.  Skin changes are associated with Priscilla lesion in question.  She describes Priscilla pain as 5 out of 10.  She does report doing monthly breast exams but as stated above, she is behind on her mammography schedule.  She states that one of her friends apparently searched [employer] for an idea of what might be going on and told Priscilla [name] that what she had could be a sign of breast cancer.  This has markedly increased her level of anxiety and worry about Priscilla lesion.   Past Medical History:  Diagnosis Date  . Anxiety   . Depression   . Menopause     Past  Surgical History:  Procedure Laterality Date  . CHOLECYSTECTOMY    . ovarian cyst rupture      Family History  Problem Relation Age of Onset  . Dementia Mother   . Thyroid disease Mother   . Hypertension Mother   . Arthritis Mother   . Diabetes Mother   . Diabetes Father     Social History Social History   Tobacco Use  . Smoking status: Current Every Day Smoker    Packs/day: 0.50    Types: Cigarettes  . Smokeless tobacco: Never Used  Substance Use Topics  . Alcohol use: No    Alcohol/week: 0.0 standard drinks  . Drug use: No    No Known Allergies  Current Outpatient Medications  Medication Sig Dispense Refill  . amitriptyline (ELAVIL) 25 MG tablet Take 2 tablets (50 mg total) by mouth at bedtime. 180 tablet 0  . cholecalciferol (VITAMIN D) 1000 UNITS tablet Take by mouth. Take 2 capsules once daily    . hyoscyamine (LEVBID) 0.375 MG 12 hr tablet Take 1 tablet (0.375 mg total) by mouth every 12 (twelve) hours as needed. 60 tablet 0  . NONFORMULARY OR COMPOUNDED ITEM Take 125 mg by mouth daily. Progesterone- must come from [facility] 90 each 3  . cephALEXin (KEFLEX) 500  MG capsule Take 1 capsule (500 mg total) by mouth 4 (four) times daily. 40 capsule 0   No current facility-administered medications for this visit.     Review of Systems Review of Systems  Skin: Positive for wound.       Lesion in question today, as well as a small open wound in Priscilla dependent portion of Priscilla right breast.  Psychiatric/Behavioral: Priscilla [name] is nervous/anxious.   All other systems reviewed and are negative.   Blood pressure 119/84, pulse 98, temperature (!) 97.3 F (36.3 C), temperature source Temporal, height 5' (1.524 m), weight 144 lb 6.4 oz (65.5 kg), SpO2 98 %. Body mass index is 28.2 kg/m.  Physical Exam Physical Exam Vitals signs reviewed. Exam conducted with a chaperone present.  Constitutional:      General: She is not in acute distress.    Appearance: Normal appearance.   HENT:     Head: Normocephalic and atraumatic.     Nose:     Comments: Covered with a mask, secondary to COVID-19 precautions, although she frequently pulls Priscilla mask down off of her nose, saying she can't breathe.    Mouth/Throat:     Comments: Covered with a mask, secondary to COVID-19 precautions. Neck:     Musculoskeletal: No neck rigidity.  Cardiovascular:     Rate and Rhythm: Normal rate.     Pulses: Normal pulses.  Pulmonary:     Effort: Pulmonary effort is normal.     Breath sounds: Normal breath sounds.  Chest:     Breasts:        Right: Normal.        Left: Normal.    Abdominal:     General: Abdomen is flat. Bowel sounds are normal.     Palpations: Abdomen is soft.  Genitourinary:    Comments: Deferred Musculoskeletal:     Right lower leg: No edema.     Left lower leg: No edema.  Lymphadenopathy:     Cervical: No cervical adenopathy.  Skin:    General: Skin is warm and dry.  Neurological:     Mental Status: She is alert.  Psychiatric:     Comments: She is very anxious and nearly tearful at one point in Priscilla interview.     Data Reviewed I reviewed Priscilla most recent mammogram, which was in May 2013.  Skin lesions were marked but there were no radiographic findings associated with them.  Assessment This is a 68 year old woman who has lesions most consistent with sebaceous cysts on both breasts.  Priscilla one on Priscilla left appears to be infected.  She is very concerned that Priscilla skin changes could potentially represent breast cancer.  Plan I excised a sebaceous cyst in clinic today as well as send cultures from purulent material within Priscilla cyst cavity.  I also performed a punch biopsy of Priscilla skin to obviate any concern about Paget's disease of Priscilla breast or other inflammatory malignant condition.  Due to Priscilla presence of infection, I used loose nylon sutures to reapproximate Priscilla skin.  I will have her come back and see me in clinic next week to have those removed.  I  prescribed Keflex for a 10-day course, but should my culture data suggest a better antibiotic option, I will contact Priscilla [name] and change to a more appropriate antibiotic.  Sebaceous Cyst Excision Procedure Note  Pre-operative Diagnosis: Epidermal cyst  Post-operative Diagnosis: same  Locations:left breast  Indications: infected sebaceous cyst  Anesthesia: Lidocaine 1% with epinephrine  without added sodium bicarbonate  Procedure Details  History of allergy to iodine: no  [name] informed of Priscilla risks (including bleeding and infection) and benefits of Priscilla  procedure and Written informed consent obtained.  Priscilla lesion and surrounding area was given a sterile prep using chlorhexidine and draped in Priscilla usual sterile fashion. An incision was made over Priscilla cyst, which was dissected free of Priscilla surrounding tissue and removed.  Priscilla cyst was filled with typical sebaceous material.  Priscilla wound was closed with 3-0 Nylon using simple interrupted stitches. Antibiotic ointment and a sterile dressing applied.  Priscilla specimen was sent for pathologic examination. Priscilla [name] tolerated Priscilla procedure well.  EBL: <2 ml  Findings: Purulent drainage associated with typical sebaceous cyst.  Priscilla entire capsule was removed.  Culture was sent.  Condition: Stable  Complications: pain.  Plan: 1. Instructed to keep Priscilla wound dry and covered for 24-48h and clean thereafter. 2. Warning signs of infection were reviewed.   3. Recommended that Priscilla [name] use OTC analgesics as needed for pain.  4. Return for suture removal in 5 days.   Punch Biopsy Procedure Note  Pre-operative Diagnosis: Skin changes of breast  Post-operative Diagnosis: same  Locations:left breast  Indications: Erythema and induration of left breast skin; [name] extremely concerned about cancer possibility.  Anesthesia: Lidocaine 1% with epinephrine with added sodium bicarbonate  Procedure Details  History of allergy to iodine: no  [name] informed of Priscilla risks (including bleeding and infection) and benefits of Priscilla  procedure and Written informed consent obtained.  Priscilla lesion and surrounding area was given a sterile prep using chlorhexidine and draped in Priscilla usual sterile fashion. Priscilla skin was then stretched perpendicular to Priscilla skin tension lines and Priscilla lesion removed using Priscilla 3 mm punch. Priscilla resulting ellipse was then closed. Priscilla wound was closed with 3-0 Nylon using simple interrupted stitches. Antibiotic ointment and a sterile dressing applied. Priscilla specimen was sent for pathologic examination. Priscilla [name] tolerated Priscilla procedure well.  EBL: <2 ml  Findings: Indurated skin most consistent with an infectious process.  Condition: Stable  Complications: pain.  Plan: 1. Instructed to keep Priscilla wound dry and covered for 24-48h and clean thereafter. 2. Warning signs of infection were reviewed.   3. Recommended that Priscilla [name] use OTC analgesics as needed for pain.  4. Return for suture removal in 5 days. Duanne GuessJennifer Delton Stelle 03/13/2019, 4:37 PM

## 2019-03-13 NOTE — Patient Instructions (Signed)
You may remove your gauze dressing the day after tomorrow(03/15/19) and shower. You may place a Band-Aid over the area to protect your clothing.  You may use Tylenol or Ibuprofen and ice pack for comfort.  Follow up here next Tuesday for suture removal.  Call if you develop a fever, excessive bleeding, or signs of increased infection.  Start and complete your antibiotic course that we have sent in.

## 2019-03-18 ENCOUNTER — Encounter: Payer: Self-pay | Admitting: General Surgery

## 2019-03-18 ENCOUNTER — Ambulatory Visit (INDEPENDENT_AMBULATORY_CARE_PROVIDER_SITE_OTHER): Payer: Medicare Other | Admitting: General Surgery

## 2019-03-18 ENCOUNTER — Other Ambulatory Visit: Payer: Self-pay

## 2019-03-18 VITALS — BP 128/83 | HR 86 | Temp 97.5°F | Ht 60.0 in | Wt 141.0 lb

## 2019-03-18 DIAGNOSIS — N6082 Other benign mammary dysplasias of left breast: Secondary | ICD-10-CM

## 2019-03-18 DIAGNOSIS — L02213 Cutaneous abscess of chest wall: Secondary | ICD-10-CM

## 2019-03-18 LAB — ANAEROBIC AND AEROBIC CULTURE

## 2019-03-18 NOTE — Patient Instructions (Addendum)
Patient is to continue taking the antibiotics Keflex for the remaining three days.   Patient has stitches removed at today's visit and apply Neosporin on the effected area.   Please apply Neosporin to affected area every day. Patient is going to keep an eye on the affected area and give our office a call if she has any problems or concerns to arise.

## 2019-03-18 NOTE — Progress Notes (Signed)
Priscilla [name] is here today for follow-up.  Last week, I excised an infected sebaceous cyst.  Cultures taken from the purulent drainage were consistent with staph epidermidis, pansensitive.  Due to the inflammation and infection, the wound was loosely reapproximated with nylon sutures.  She is here today to have those removed.  She states that she is been doing fairly well since her procedure.  She has had some mild diarrhea from the antibiotics, but states this is actually better than usual for her.  She denies any nausea or vomiting.  No fevers or chills.  She reports some itching at the incision and that the bra line irritates the site.  Today's Vitals   03/18/19 1028  BP: 128/83  Pulse: 86  Temp: (!) 97.5 F (36.4 C)  TempSrc: Temporal  Weight: 141 lb (64 kg)  Height: 5' (1.524 m)  PainSc: 0-No pain   Body mass index is 27.54 kg/m. Focused examination of the site: The nylon sutures are intact.  The skin is well approximated.  There is still some surrounding erythema and scaliness to her skin.  There is no purulent drainage present.  I remove the nylon sutures in clinic today.  I applied a layer of triple antibiotic ointment.  She should continue to keep the area clean and may apply a thin layer of ointment daily.  I reassured her that the itching was normal.  She should complete her course of antibiotics.  If she continues to have difficulty, she should contact her office to be reevaluated, otherwise I will see her on an as-needed basis.

## 2019-03-31 ENCOUNTER — Other Ambulatory Visit: Payer: Self-pay

## 2019-04-01 ENCOUNTER — Ambulatory Visit (INDEPENDENT_AMBULATORY_CARE_PROVIDER_SITE_OTHER): Payer: Medicare Other | Admitting: Nurse Practitioner

## 2019-04-01 ENCOUNTER — Telehealth: Payer: Self-pay | Admitting: Nurse Practitioner

## 2019-04-01 ENCOUNTER — Other Ambulatory Visit: Payer: Self-pay

## 2019-04-01 ENCOUNTER — Encounter: Payer: Self-pay | Admitting: Nurse Practitioner

## 2019-04-01 VITALS — BP 131/84 | HR 87 | Temp 98.2°F | Ht 60.2 in | Wt 144.0 lb

## 2019-04-01 DIAGNOSIS — F3341 Major depressive disorder, recurrent, in partial remission: Secondary | ICD-10-CM

## 2019-04-01 DIAGNOSIS — Z0000 Encounter for general adult medical examination without abnormal findings: Secondary | ICD-10-CM | POA: Diagnosis not present

## 2019-04-01 DIAGNOSIS — Z780 Asymptomatic menopausal state: Secondary | ICD-10-CM | POA: Diagnosis not present

## 2019-04-01 DIAGNOSIS — E559 Vitamin D deficiency, unspecified: Secondary | ICD-10-CM

## 2019-04-01 DIAGNOSIS — F17210 Nicotine dependence, cigarettes, uncomplicated: Secondary | ICD-10-CM

## 2019-04-01 DIAGNOSIS — K581 Irritable bowel syndrome with constipation: Secondary | ICD-10-CM | POA: Diagnosis not present

## 2019-04-01 MED ORDER — NONFORMULARY OR COMPOUNDED ITEM: 125.0000 mg | Freq: Every day | 3 refills | Status: DC

## 2019-04-01 MED ORDER — AMITRIPTYLINE HCL 25 MG PO TABS: 50.0000 mg | ORAL_TABLET | Freq: Every day | ORAL | 3 refills | Status: DC

## 2019-04-01 MED ORDER — LINACLOTIDE 145 MCG PO CAPS: 145.0000 ug | ORAL_CAPSULE | Freq: Every day | ORAL | 2 refills | Status: DC

## 2019-04-01 NOTE — Assessment & Plan Note (Signed)
Chronic, stable on current medication regimen (compounded Progesterone 125MG daily).  Continue current regimen.  Script to be faxed to pharmacy in [place].  She is aware of long term risks of hormone use and refuses to taper off. 

## 2019-04-01 NOTE — Telephone Encounter (Signed)
Noted, thank you

## 2019-04-01 NOTE — Assessment & Plan Note (Signed)
Chronic, ongoing issue.  Has tried all OTC options without success and diet changes.  Ongoing issues since childhood with constipation.  Will trial Linzess, start out at low dose and if tolerated increase to recommend 290 MCG dose for IBS-C.  Return in 6 months or sooner if no improvement.

## 2019-04-01 NOTE — Patient Instructions (Signed)

## 2019-04-01 NOTE — Telephone Encounter (Signed)
Called pt to let her know that prescription is up front for her, no answer, left vm

## 2019-04-01 NOTE — Assessment & Plan Note (Signed)
Chronic, stable on current medication regimen.  Continue current regimen and adjust as needed.  Benefits both mood and sleep pattern.  Refills sent. 

## 2019-04-01 NOTE — Assessment & Plan Note (Signed)
I have recommended complete cessation of tobacco use. I have discussed various options available for assistance with tobacco cessation including over the counter methods (Nicotine gum, patch and lozenges). We also discussed prescription options (Chantix, Nicotine Inhaler / Nasal Spray). The patient is not interested in pursuing any prescription tobacco cessation options at this time.  

## 2019-04-01 NOTE — Progress Notes (Signed)
BP 131/84   Pulse 87   Temp 98.2 F (36.8 C) (Oral)   Ht 5' 0.2" (1.529 m)   Wt 144 lb (65.3 kg)   LMP  (LMP Unknown)   SpO2 98%   BMI 27.94 kg/m    Subjective:    Patient ID: Priscilla [name], female    DOB: Sep 08, 1950, 68 y.o.   MRN: [id number]  HPI: Priscilla [name] is a 68 y.o. female presenting on 04/01/2019 for comprehensive medical examination. Current medical complaints include: none  She currently lives with: self Menopausal Symptoms: continues to take Progesterone, is not "going to come off until 90", she is aware of long term risks and reports "my previous provider talked to me all the time about it and so has my pharmacist, but I am not stopping it".  IRRITABLE BOWEL SYNDROME (CONSTIPATION): Has tried Metamucil, Colace, Senna + diet changes.  Can go a week without having a bowel movement and reports frustration with constant bloating and abdominal cramping during periods of increased constipation issues.  When child her mother gave her home remedies, has had ongoing issues since childhood.  Refuses GI referral or colonoscopy, at length discussion had.  On average has a BM once a week, sometimes less.  Denies melena, rectal bleeding, diarrhea, or N&V.  Does report often straining to pass BM.  DEPRESSION Takes Amitriptyline 25 MG for sleep and mood. Mood status: stable Satisfied with current treatment?: yes Symptom severity: mild  Duration of current treatment : chronic Side effects: no Medication compliance: good compliance Psychotherapy/counseling: none Depressed mood: no Anxious mood: no Anhedonia: no Significant weight loss or gain: no Insomnia: none Fatigue: no Feelings of worthlessness or guilt: no Impaired concentration/indecisiveness: no Suicidal ideations: no Hopelessness: no Crying spells: no Depression screen [facility] 2/9 04/01/2019 02/19/2018 02/12/2017 02/09/2016 03/16/2015  Decreased Interest 0 0 0 0 3  Down, Depressed, Hopeless 0 0 0 0 3  PHQ - 2 Score 0 0  0 0 6  Altered sleeping 0 0 0 - 0  Tired, decreased energy 0 0 0 - 1  Change in appetite 0 0 0 - 0  Feeling bad or failure about yourself  0 0 0 - 3  Trouble concentrating 0 0 0 - 2  Moving slowly or fidgety/restless 0 0 0 - 0  Suicidal thoughts 0 0 0 - 0  PHQ-9 Score 0 0 0 - 12  Difficult doing work/chores Not difficult at all Not difficult at all - - Very difficult    The patient does not have a history of falls. I did not complete a risk assessment for falls. A plan of care for falls was not documented.   Past Medical History:  Past Medical History:  Diagnosis Date  . Anxiety   . Depression   . Menopause     Surgical History:  Past Surgical History:  Procedure Laterality Date  . CHOLECYSTECTOMY    . ovarian cyst rupture      Medications:  Current Outpatient Medications on File Prior to Visit  Medication Sig  . cholecalciferol (VITAMIN D) 1000 UNITS tablet Take by mouth. Take 2 capsules once daily  . hyoscyamine (LEVBID) 0.375 MG 12 hr tablet Take 1 tablet (0.375 mg total) by mouth every 12 (twelve) hours as needed.   No current facility-administered medications on file prior to visit.     Allergies:  No Known Allergies  Social History:  Social History   Socioeconomic History  . Marital status: Married  Spouse name: Not on file  . Number of children: Not on file  . Years of education: Not on file  . Highest education level: Not on file  Occupational History  . Not on file  Social Needs  . Financial resource strain: Not on file  . Food insecurity    Worry: Not on file    Inability: Not on file  . Transportation needs    Medical: Not on file    Non-medical: Not on file  Tobacco Use  . Smoking status: Current Every Day Smoker    Packs/day: 0.50    Types: Cigarettes  . Smokeless tobacco: Never Used  Substance and Sexual Activity  . Alcohol use: No    Alcohol/week: 0.0 standard drinks  . Drug use: No  . Sexual activity: Yes  Lifestyle  . Physical  activity    Days per week: Not on file    Minutes per session: Not on file  . Stress: Not on file  Relationships  . Social Herbalist on phone: Not on file    Gets together: Not on file    Attends religious service: Not on file    Active member of club or organization: Not on file    Attends meetings of clubs or organizations: Not on file    Relationship status: Not on file  . Intimate partner violence    Fear of current or ex partner: Not on file    Emotionally abused: Not on file    Physically abused: Not on file    Forced sexual activity: Not on file  Other Topics Concern  . Not on file  Social History Narrative  . Not on file   Social History   Tobacco Use  Smoking Status Current Every Day Smoker  . Packs/day: 0.50  . Types: Cigarettes  Smokeless Tobacco Never Used   Social History   Substance and Sexual Activity  Alcohol Use No  . Alcohol/week: 0.0 standard drinks    Family History:  Family History  Problem Relation Age of Onset  . Dementia Mother   . Thyroid disease Mother   . Hypertension Mother   . Arthritis Mother   . Diabetes Mother   . Diabetes Father     Past medical history, surgical history, medications, allergies, family history and social history reviewed with patient today and changes made to appropriate areas of the chart.   Review of Systems - negative All other ROS negative except what is listed above and in the HPI.      Objective:    BP 131/84   Pulse 87   Temp 98.2 F (36.8 C) (Oral)   Ht 5' 0.2" (1.529 m)   Wt 144 lb (65.3 kg)   LMP  (LMP Unknown)   SpO2 98%   BMI 27.94 kg/m   Wt Readings from Last 3 Encounters:  04/01/19 144 lb (65.3 kg)  03/18/19 141 lb (64 kg)  03/13/19 144 lb 6.4 oz (65.5 kg)    Physical Exam Constitutional:      General: She is awake. She is not in acute distress.    Appearance: She is well-developed. She is not ill-appearing.  HENT:     Head: Normocephalic and atraumatic.     Right  Ear: Hearing, tympanic membrane, ear canal and external ear normal. No drainage.     Left Ear: Hearing, tympanic membrane, ear canal and external ear normal. No drainage.     Nose: Nose normal.  Right Sinus: No maxillary sinus tenderness or frontal sinus tenderness.     Left Sinus: No maxillary sinus tenderness or frontal sinus tenderness.     Mouth/Throat:     Mouth: Mucous membranes are moist.     Pharynx: Oropharynx is clear. Uvula midline. No pharyngeal swelling, oropharyngeal exudate or posterior oropharyngeal erythema.  Eyes:     General: Lids are normal.        Right eye: No discharge.        Left eye: No discharge.     Extraocular Movements: Extraocular movements intact.     Conjunctiva/sclera: Conjunctivae normal.     Pupils: Pupils are equal, round, and reactive to light.     Visual Fields: Right eye visual fields normal and left eye visual fields normal.  Neck:     Musculoskeletal: Normal range of motion and neck supple.     Thyroid: No thyromegaly.     Vascular: No carotid bruit.     Trachea: Trachea normal.  Cardiovascular:     Rate and Rhythm: Normal rate and regular rhythm.     Heart sounds: Normal heart sounds. No murmur. No gallop.   Pulmonary:     Effort: Pulmonary effort is normal. No accessory muscle usage or respiratory distress.     Breath sounds: Normal breath sounds.  Chest:     Comments: Recent breast exam performed.  Small scar from recent cyst removal to left medial breast, at 2 o'clock position.  Well-approximated with no drainage. Abdominal:     General: Bowel sounds are normal.     Palpations: Abdomen is soft. There is no hepatomegaly or splenomegaly.     Tenderness: There is no abdominal tenderness.  Genitourinary:    Comments: Refuses exam. Musculoskeletal: Normal range of motion.     Right lower leg: No edema.     Left lower leg: No edema.  Lymphadenopathy:     Head:     Right side of head: No submental, submandibular, tonsillar,  preauricular or posterior auricular adenopathy.     Left side of head: No submental, submandibular, tonsillar, preauricular or posterior auricular adenopathy.     Cervical: No cervical adenopathy.  Skin:    General: Skin is warm and dry.     Capillary Refill: Capillary refill takes less than 2 seconds.     Findings: No rash.  Neurological:     Mental Status: She is alert and oriented to person, place, and time.     Cranial Nerves: Cranial nerves are intact.     Gait: Gait is intact.     Deep Tendon Reflexes: Reflexes are normal and symmetric.     Reflex Scores:      Brachioradialis reflexes are 2+ on the right side and 2+ on the left side.      Patellar reflexes are 2+ on the right side and 2+ on the left side. Psychiatric:        Attention and Perception: Attention normal.        Mood and Affect: Mood normal.        Speech: Speech normal.        Behavior: Behavior normal. Behavior is cooperative.        Thought Content: Thought content normal.        Judgment: Judgment normal.     Results for orders placed or performed in visit on 03/13/19  Anaerobic and Aerobic Culture   Specimen: Abscess   ABSCESS  Result Value Ref Range   Anaerobic Culture  Final report    Result 1 Comment    Aerobic Culture Final report (A)    Result 1 Comment (A)    Antimicrobial Susceptibility Comment       Assessment & Plan:   Problem List Items Addressed This Visit      Digestive   IBS (irritable bowel syndrome)    Chronic, ongoing issue.  Has tried all OTC options without success and diet changes.  Ongoing issues since childhood with constipation.  Will trial Linzess, start out at low dose and if tolerated increase to recommend 290 MCG dose for IBS-C.  Return in 6 months or sooner if no improvement.      Relevant Medications   linaclotide (LINZESS) 145 MCG CAPS capsule     Other   Menopause    Chronic, stable on current medication regimen (compounded Progesterone 125MG  daily).  Continue  current regimen.  Script to be faxed to pharmacy in [place].  She is aware of long term risks of hormone use and refuses to taper off.      Depression    Chronic, stable on current medication regimen.  Continue current regimen and adjust as needed.  Benefits both mood and sleep pattern.  Refills sent.      Relevant Medications   amitriptyline (ELAVIL) 25 MG tablet   Nicotine dependence, cigarettes, uncomplicated    I have recommended complete cessation of tobacco use. I have discussed various options available for assistance with tobacco cessation including over the counter methods (Nicotine gum, patch and lozenges). We also discussed prescription options (Chantix, Nicotine Inhaler / Nasal Spray). The patient is not interested in pursuing any prescription tobacco cessation options at this time.        Other Visit Diagnoses    Annual physical exam    -  Primary   Relevant Orders   CBC with Differential/Platelet out   Comprehensive metabolic panel   Lipid Panel w/o Chol/HDL Ratio out   TSH   Vitamin D deficiency       Takes daily supplement, check Vitamin D level today.   Relevant Orders   Vit D  25 hydroxy (rtn osteoporosis monitoring)       Follow up plan: Return in about 6 months (around 09/29/2019) for IBS and Mood.   LABORATORY TESTING:  - Pap smear: refused  IMMUNIZATIONS:   - Tdap: Tetanus vaccination status reviewed: last tetanus booster within 10 years. - Influenza: Refused - Pneumovax: Refused - Prevnar: Refused - HPV: Not applicable - Zostavax vaccine: Up to date  SCREENING: -Mammogram: Refused  - Colonoscopy: Refused  - Bone Density: Refused  -Hearing Test: Not applicable  -Spirometry: Not applicable   PATIENT COUNSELING:   Advised to take 1 mg of folate supplement per day if capable of pregnancy.   Sexuality: Discussed sexually transmitted diseases, partner selection, use of condoms, avoidance of unintended pregnancy  and contraceptive alternatives.    Advised to avoid cigarette smoking.  I discussed with the patient that most people either abstain from alcohol or drink within safe limits (<=14/week and <=4 drinks/occasion for males, <=7/weeks and <= 3 drinks/occasion for females) and that the risk for alcohol disorders and other health effects rises proportionally with the number of drinks per week and how often a drinker exceeds daily limits.  Discussed cessation/primary prevention of drug use and availability of treatment for abuse.   Diet: Encouraged to adjust caloric intake to maintain  or achieve ideal body weight, to reduce intake of dietary saturated fat and  total fat, to limit sodium intake by avoiding high sodium foods and not adding table salt, and to maintain adequate dietary potassium and calcium preferably from fresh fruits, vegetables, and low-fat dairy products.    stressed the importance of regular exercise  Injury prevention: Discussed safety belts, safety helmets, smoke detector, smoking near bedding or upholstery.   Dental health: Discussed importance of regular tooth brushing, flossing, and dental visits.    NEXT PREVENTATIVE PHYSICAL DUE IN 1 YEAR. Return in about 6 months (around 09/29/2019) for IBS and Mood.

## 2019-04-02 ENCOUNTER — Encounter: Payer: Self-pay | Admitting: Nurse Practitioner

## 2019-04-02 DIAGNOSIS — E782 Mixed hyperlipidemia: Secondary | ICD-10-CM | POA: Insufficient documentation

## 2019-04-02 DIAGNOSIS — E7800 Pure hypercholesterolemia, unspecified: Secondary | ICD-10-CM | POA: Insufficient documentation

## 2019-04-02 LAB — CBC WITH DIFFERENTIAL/PLATELET
Basophils Absolute: 0.1 10*3/uL (ref 0.0–0.2)
Basos: 1 %
EOS (ABSOLUTE): 0.2 10*3/uL (ref 0.0–0.4)
Eos: 3 %
Hematocrit: 48.8 % — ABNORMAL HIGH (ref 34.0–46.6)
Hemoglobin: 16.3 g/dL — ABNORMAL HIGH (ref 11.1–15.9)
Immature Grans (Abs): 0 10*3/uL (ref 0.0–0.1)
Immature Granulocytes: 0 %
Lymphocytes Absolute: 1.4 10*3/uL (ref 0.7–3.1)
Lymphs: 22 %
MCH: 33.1 pg — ABNORMAL HIGH (ref 26.6–33.0)
MCHC: 33.4 g/dL (ref 31.5–35.7)
MCV: 99 fL — ABNORMAL HIGH (ref 79–97)
Monocytes Absolute: 0.4 10*3/uL (ref 0.1–0.9)
Monocytes: 6 %
Neutrophils Absolute: 4.3 10*3/uL (ref 1.4–7.0)
Neutrophils: 68 %
Platelets: 254 10*3/uL (ref 150–450)
RBC: 4.92 x10E6/uL (ref 3.77–5.28)
RDW: 12.6 % (ref 11.7–15.4)
WBC: 6.3 10*3/uL (ref 3.4–10.8)

## 2019-04-02 LAB — COMPREHENSIVE METABOLIC PANEL
ALT: 11 IU/L (ref 0–32)
AST: 13 IU/L (ref 0–40)
Albumin/Globulin Ratio: 1.5 (ref 1.2–2.2)
Albumin: 4.6 g/dL (ref 3.8–4.8)
Alkaline Phosphatase: 86 IU/L (ref 39–117)
BUN/Creatinine Ratio: 15 (ref 12–28)
BUN: 10 mg/dL (ref 8–27)
Bilirubin Total: 0.2 mg/dL (ref 0.0–1.2)
CO2: 23 mmol/L (ref 20–29)
Calcium: 10.4 mg/dL — ABNORMAL HIGH (ref 8.7–10.3)
Chloride: 101 mmol/L (ref 96–106)
Creatinine, Ser: 0.66 mg/dL (ref 0.57–1.00)
GFR calc Af Amer: 106 mL/min/{1.73_m2} (ref >59)
GFR calc non Af Amer: 92 mL/min/{1.73_m2} (ref >59)
Globulin, Total: 3.1 g/dL (ref 1.5–4.5)
Glucose: 102 mg/dL — ABNORMAL HIGH (ref 65–99)
Potassium: 4.4 mmol/L (ref 3.5–5.2)
Sodium: 142 mmol/L (ref 134–144)
Total Protein: 7.7 g/dL (ref 6.0–8.5)

## 2019-04-02 LAB — LIPID PANEL W/O CHOL/HDL RATIO
Cholesterol, Total: 257 mg/dL — ABNORMAL HIGH (ref 100–199)
HDL: 60 mg/dL (ref >39)
LDL Chol Calc (NIH): 170 mg/dL — ABNORMAL HIGH (ref 0–99)
Triglycerides: 148 mg/dL (ref 0–149)
VLDL Cholesterol Cal: 27 mg/dL (ref 5–40)

## 2019-04-02 LAB — VITAMIN D 25 HYDROXY (VIT D DEFICIENCY, FRACTURES): Vit D, 25-Hydroxy: 41.2 ng/mL (ref 30.0–100.0)

## 2019-04-02 LAB — TSH: TSH: 1.76 u[IU]/mL (ref 0.450–4.500)

## 2019-05-13 ENCOUNTER — Other Ambulatory Visit: Payer: Self-pay | Admitting: Nurse Practitioner

## 2019-05-13 MED ORDER — NONFORMULARY OR COMPOUNDED ITEM: 125.0000 mg | Freq: Every day | 3 refills | Status: DC

## 2019-07-29 ENCOUNTER — Other Ambulatory Visit: Payer: Self-pay | Admitting: Unknown Physician Specialty

## 2019-08-07 ENCOUNTER — Ambulatory Visit: Payer: Medicare Other

## 2019-09-18 ENCOUNTER — Ambulatory Visit: Payer: Medicare Other

## 2019-09-29 ENCOUNTER — Ambulatory Visit: Payer: Medicare Other | Admitting: Nurse Practitioner

## 2019-11-05 ENCOUNTER — Telehealth: Payer: Self-pay | Admitting: Nurse Practitioner

## 2019-11-05 NOTE — Telephone Encounter (Signed)
Copied from CRM [id number]. Topic: Medicare AWV >> Nov 05, 2019 12:28 PM Claudette Laws R wrote: Reason for CRM: Left message for patient to call back and schedule Medicare Annual Wellness Visit (AWV) to be done virtually.  No hx of AWV  Please schedule at anytime with [facility] Health Advisor.      45 Minutes appointment

## 2020-02-05 ENCOUNTER — Telehealth: Payer: Self-pay | Admitting: Nurse Practitioner

## 2020-02-05 NOTE — Telephone Encounter (Signed)
Copied from CRM [id number]. Topic: Medicare AWV >> Feb 05, 2020 12:38 PM Claudette Laws R wrote: Reason for CRM:  Left message for patient to call back and schedule the Medicare Annual Wellness Visit (AWV) virtually.  Last AWV 02/12/2017   Please schedule at anytime with [facility] Health Advisor.  45 minute appointment  Any questions, please call me at [phone]

## 2020-03-18 ENCOUNTER — Other Ambulatory Visit: Payer: Self-pay | Admitting: Unknown Physician Specialty

## 2020-03-18 ENCOUNTER — Other Ambulatory Visit: Payer: Self-pay | Admitting: Nurse Practitioner

## 2020-03-18 NOTE — Telephone Encounter (Signed)
Patient called and advised she will need to schedule an OV due to no visit since 04/01/19, she verbalized understanding. Appointment for CPE scheduled for Wednesday, 04/14/20 at 1120 with Aura Dials, NP. Advised fasting for CPE labs. Advised refill will be sent in.

## 2020-04-14 ENCOUNTER — Encounter: Payer: Self-pay | Admitting: Nurse Practitioner

## 2020-04-14 ENCOUNTER — Other Ambulatory Visit: Payer: Self-pay

## 2020-04-14 ENCOUNTER — Ambulatory Visit (INDEPENDENT_AMBULATORY_CARE_PROVIDER_SITE_OTHER): Payer: Medicare Other | Admitting: Nurse Practitioner

## 2020-04-14 VITALS — BP 139/79 | HR 94 | Temp 98.4°F | Ht 59.76 in | Wt 139.4 lb

## 2020-04-14 DIAGNOSIS — F3341 Major depressive disorder, recurrent, in partial remission: Secondary | ICD-10-CM

## 2020-04-14 DIAGNOSIS — K581 Irritable bowel syndrome with constipation: Secondary | ICD-10-CM

## 2020-04-14 DIAGNOSIS — E538 Deficiency of other specified B group vitamins: Secondary | ICD-10-CM | POA: Diagnosis not present

## 2020-04-14 DIAGNOSIS — Z780 Asymptomatic menopausal state: Secondary | ICD-10-CM

## 2020-04-14 DIAGNOSIS — F17210 Nicotine dependence, cigarettes, uncomplicated: Secondary | ICD-10-CM

## 2020-04-14 DIAGNOSIS — Z1329 Encounter for screening for other suspected endocrine disorder: Secondary | ICD-10-CM

## 2020-04-14 DIAGNOSIS — Z0000 Encounter for general adult medical examination without abnormal findings: Secondary | ICD-10-CM | POA: Diagnosis not present

## 2020-04-14 DIAGNOSIS — E559 Vitamin D deficiency, unspecified: Secondary | ICD-10-CM

## 2020-04-14 DIAGNOSIS — E7800 Pure hypercholesterolemia, unspecified: Secondary | ICD-10-CM

## 2020-04-14 MED ORDER — NONFORMULARY OR COMPOUNDED ITEM: 125.0000 mg | Freq: Every day | 4 refills | Status: DC

## 2020-04-14 MED ORDER — LUBIPROSTONE 8 MCG PO CAPS: 8.0000 ug | ORAL_CAPSULE | Freq: Two times a day (BID) | ORAL | 3 refills | Status: DC

## 2020-04-14 MED ORDER — AMITRIPTYLINE HCL 25 MG PO TABS
50.0000 mg | ORAL_TABLET | Freq: Every day | ORAL | 4 refills | Status: DC
Start: 2020-04-14 — End: 2021-05-30

## 2020-04-14 MED ORDER — HYOSCYAMINE SULFATE ER 0.375 MG PO TB12
0.3750 mg | ORAL_TABLET | Freq: Two times a day (BID) | ORAL | 2 refills | Status: DC | PRN
Start: 2020-04-14 — End: 2021-10-12

## 2020-04-14 NOTE — Assessment & Plan Note (Signed)
I have recommended complete cessation of tobacco use. I have discussed various options available for assistance with tobacco cessation including over the counter methods (Nicotine gum, patch and lozenges). We also discussed prescription options (Chantix, Nicotine Inhaler / Nasal Spray). The patient is not interested in pursuing any prescription tobacco cessation options at this time.  

## 2020-04-14 NOTE — Patient Instructions (Signed)
Healthy Eating Following a healthy eating pattern may help you to achieve and maintain a healthy body weight, reduce the risk of chronic disease, and live a long and productive life. It is important to follow a healthy eating pattern at an appropriate calorie level for your body. Your nutritional needs should be met primarily through food by choosing a variety of nutrient-rich foods. What are tips for following this plan? Reading food labels  Read labels and choose the following: ? Reduced or low sodium. ? Juices with 100% fruit juice. ? Foods with low saturated fats and high polyunsaturated and monounsaturated fats. ? Foods with whole grains, such as whole wheat, cracked wheat, brown rice, and wild rice. ? Whole grains that are fortified with folic acid. This is recommended for women who are pregnant or who want to become pregnant.  Read labels and avoid the following: ? Foods with a lot of added sugars. These include foods that contain brown sugar, corn sweetener, corn syrup, dextrose, fructose, glucose, high-fructose corn syrup, honey, invert sugar, lactose, malt syrup, maltose, molasses, raw sugar, sucrose, trehalose, or turbinado sugar.  Do not eat more than the following amounts of added sugar per day:  6 teaspoons (25 g) for women.  9 teaspoons (38 g) for men. ? Foods that contain processed or refined starches and grains. ? Refined grain products, such as white flour, degermed cornmeal, white bread, and white rice. Shopping  Choose nutrient-rich snacks, such as vegetables, whole fruits, and nuts. Avoid high-calorie and high-sugar snacks, such as potato chips, fruit snacks, and candy.  Use oil-based dressings and spreads on foods instead of solid fats such as butter, stick margarine, or cream cheese.  Limit pre-made sauces, mixes, and "instant" products such as flavored rice, instant noodles, and ready-made pasta.  Try more plant-protein sources, such as tofu, tempeh, black beans,  edamame, lentils, nuts, and seeds.  Explore eating plans such as the Mediterranean diet or vegetarian diet. Cooking  Use oil to saut or stir-fry foods instead of solid fats such as butter, stick margarine, or lard.  Try baking, boiling, grilling, or broiling instead of frying.  Remove the fatty part of meats before cooking.  Steam vegetables in water or broth. Meal planning   At meals, imagine dividing your plate into fourths: ? One-half of your plate is fruits and vegetables. ? One-fourth of your plate is whole grains. ? One-fourth of your plate is protein, especially lean meats, poultry, eggs, tofu, beans, or nuts.  Include low-fat dairy as part of your daily diet. Lifestyle  Choose healthy options in all settings, including home, work, school, restaurants, or stores.  Prepare your food safely: ? Wash your hands after handling raw meats. ? Keep food preparation surfaces clean by regularly washing with hot, soapy water. ? Keep raw meats separate from ready-to-eat foods, such as fruits and vegetables. ? Cook seafood, meat, poultry, and eggs to the recommended internal temperature. ? Store foods at safe temperatures. In general:  Keep cold foods at 90F (4.4C) or below.  Keep hot foods at 190F (60C) or above.  Keep your freezer at [facility] (-17.8C) or below.  Foods are no longer safe to eat when they have been between the temperatures of 40-190F (4.4-60C) for more than 2 hours. What foods should I eat? Fruits Aim to eat 2 cup-equivalents of fresh, canned (in natural juice), or frozen fruits each day. Examples of 1 cup-equivalent of fruit include 1 small apple, 8 large strawberries, 1 cup canned fruit,  cup  dried fruit, or 1 cup 100% juice. Vegetables Aim to eat 2-3 cup-equivalents of fresh and frozen vegetables each day, including different varieties and colors. Examples of 1 cup-equivalent of vegetables include 2 medium carrots, 2 cups raw, leafy greens, 1 cup chopped  vegetable (raw or cooked), or 1 medium baked potato. Grains Aim to eat 6 ounce-equivalents of whole grains each day. Examples of 1 ounce-equivalent of grains include 1 slice of bread, 1 cup ready-to-eat cereal, 3 cups popcorn, or  cup cooked rice, pasta, or cereal. Meats and other proteins Aim to eat 5-6 ounce-equivalents of protein each day. Examples of 1 ounce-equivalent of protein include 1 egg, 1/2 cup nuts or seeds, or 1 tablespoon (16 g) peanut butter. A cut of meat or fish that is the size of a deck of cards is about 3-4 ounce-equivalents.  Of the protein you eat each week, try to have at least 8 ounces come from seafood. This includes salmon, trout, herring, and anchovies. Dairy Aim to eat 3 cup-equivalents of fat-free or low-fat dairy each day. Examples of 1 cup-equivalent of dairy include 1 cup (240 mL) milk, 8 ounces (250 g) yogurt, 1 ounces (44 g) natural cheese, or 1 cup (240 mL) fortified soy milk. Fats and oils  Aim for about 5 teaspoons (21 g) per day. Choose monounsaturated fats, such as canola and olive oils, avocados, peanut butter, and most nuts, or polyunsaturated fats, such as sunflower, corn, and soybean oils, walnuts, pine nuts, sesame seeds, sunflower seeds, and flaxseed. Beverages  Aim for six 8-oz glasses of water per day. Limit coffee to three to five 8-oz cups per day.  Limit caffeinated beverages that have added calories, such as soda and energy drinks.  Limit alcohol intake to no more than 1 drink a day for nonpregnant women and 2 drinks a day for men. One drink equals 12 oz of beer (355 mL), 5 oz of wine (148 mL), or 1 oz of hard liquor (44 mL). Seasoning and other foods  Avoid adding excess amounts of salt to your foods. Try flavoring foods with herbs and spices instead of salt.  Avoid adding sugar to foods.  Try using oil-based dressings, sauces, and spreads instead of solid fats. This information is based on general [place] nutrition guidelines. For more  information, visit [employer]. Exact amounts may vary based on your nutrition needs. Summary  A healthy eating plan may help you to maintain a healthy weight, reduce the risk of chronic diseases, and stay active throughout your life.  Plan your meals. Make sure you eat the right portions of a variety of nutrient-rich foods.  Try baking, boiling, grilling, or broiling instead of frying.  Choose healthy options in all settings, including home, work, school, restaurants, or stores. This information is not intended to replace advice given to you by your health care provider. Make sure you discuss any questions you have with your health care provider. Document Revised: 08/06/2017 Document Reviewed: 08/06/2017 Elsevier Patient Education  [place].

## 2020-04-14 NOTE — Assessment & Plan Note (Signed)
Chronic, stable on current medication regimen (compounded Progesterone 125MG daily).  Continue current regimen.  Script to be faxed to pharmacy in [place].  She is aware of long term risks of hormone use and refuses to taper off. 

## 2020-04-14 NOTE — Assessment & Plan Note (Signed)
Chronic, ongoing issue.  Has tried all OTC options without success and diet changes.  Ongoing issues since childhood with constipation.  Will trial Lubiprostone, start out at 8 MCG BID for IBS-C.  Return in 6 months or sooner if no improvement.

## 2020-04-14 NOTE — Assessment & Plan Note (Signed)
Chronic, stable on current medication regimen.  Continue current regimen and adjust as needed.  Benefits both mood and sleep pattern.  Refills sent.

## 2020-04-14 NOTE — Progress Notes (Signed)
BP 139/79   Pulse 94   Temp 98.4 F (36.9 C) (Oral)   Ht 4' 11.76" (1.518 m)   Wt 139 lb 6.4 oz (63.2 kg)   LMP  (LMP Unknown)   SpO2 98%   BMI 27.44 kg/m    Subjective:    Patient ID: Priscilla [name], female    DOB: 1950/11/09, 69 y.o.   MRN: [id number]  HPI: Priscilla [name] is a 69 y.o. female presenting on 04/14/2020 for comprehensive medical examination. Current medical complaints include: none  She currently lives with: self Menopausal Symptoms: continues to take Progesterone, is not "going to come off until 89", she is aware of long term risks and reports "my previous provider talked to me all the time about it and so has my pharmacist, but I am not stopping it".  The 10-year ASCVD risk score Mikey Bussing DC Brooke Bonito., et al., 2013) is: 16%   Values used to calculate the score:     Age: 27 years     Sex: Female     Is Non-Hispanic African American: No     Diabetic: No     Tobacco smoker: Yes     Systolic Blood Pressure: 563 mmHg     Is BP treated: No     HDL Cholesterol: 60 mg/dL     Total Cholesterol: 257 mg/dL   IRRITABLE BOWEL SYNDROME (CONSTIPATION): Has tried Metamucil, Colace, Senna + diet changes. Had issues with Linzess, feels this caused swelling to ankles.  Does take Miralax which helps.  Can go a week without having a bowel movement and reports frustration with constant bloating and abdominal cramping during periods of increased constipation issues.  When child her mother gave her home remedies, has had ongoing issues since childhood.  Refuses GI referral or colonoscopy, at length discussion had.  On average has a BM once a week, sometimes less. Denies melena, rectal bleeding, diarrhea, or N&V.  Does report often straining to pass BM.  DEPRESSION Takes Amitriptyline 25 MG for sleep and mood. Mood status: stable Satisfied with current treatment?: yes Symptom severity: mild  Duration of current treatment : chronic Side effects: no Medication compliance: good  compliance Psychotherapy/counseling: none Depressed mood: no Anxious mood: no Anhedonia: no Significant weight loss or gain: no Insomnia: none Fatigue: no Feelings of worthlessness or guilt: no Impaired concentration/indecisiveness: no Suicidal ideations: no Hopelessness: no Crying spells: no Depression screen [facility] 2/9 04/14/2020 04/14/2020 04/01/2019 02/19/2018 02/12/2017  Decreased Interest 0 0 0 0 0  Down, Depressed, Hopeless 0 0 0 0 0  PHQ - 2 Score 0 0 0 0 0  Altered sleeping 1 - 0 0 0  Tired, decreased energy 1 - 0 0 0  Change in appetite 0 - 0 0 0  Feeling bad or failure about yourself  0 - 0 0 0  Trouble concentrating 0 - 0 0 0  Moving slowly or fidgety/restless 0 - 0 0 0  Suicidal thoughts 0 - 0 0 0  PHQ-9 Score 2 - 0 0 0  Difficult doing work/chores Not difficult at all - Not difficult at all Not difficult at all -    The patient does not have a history of falls. I did not complete a risk assessment for falls. A plan of care for falls was not documented.   Past Medical History:  Past Medical History:  Diagnosis Date  . Anxiety   . Depression   . Menopause     Surgical History:  Past  Surgical History:  Procedure Laterality Date  . CHOLECYSTECTOMY    . ovarian cyst rupture      Medications:  Current Outpatient Medications on File Prior to Visit  Medication Sig  . cholecalciferol (VITAMIN D) 1000 UNITS tablet Take by mouth. Take 2 capsules once daily   No current facility-administered medications on file prior to visit.    Allergies:  No Known Allergies  Social History:  Social History   Socioeconomic History  . Marital status: Married    Spouse name: Not on file  . Number of children: Not on file  . Years of education: Not on file  . Highest education level: Not on file  Occupational History  . Not on file  Tobacco Use  . Smoking status: Current Every Day Smoker    Packs/day: 0.50    Types: Cigarettes  . Smokeless tobacco: Never Used   Vaping Use  . Vaping Use: Never used  Substance and Sexual Activity  . Alcohol use: No    Alcohol/week: 0.0 standard drinks  . Drug use: No  . Sexual activity: Yes  Other Topics Concern  . Not on file  Social History Narrative  . Not on file   Social Determinants of Health   Financial Resource Strain:   . Difficulty of Paying Living Expenses: Not on file  Food Insecurity:   . Worried About Charity fundraiser in the Last Year: Not on file  . Ran Out of Food in the Last Year: Not on file  Transportation Needs:   . Lack of Transportation (Medical): Not on file  . Lack of Transportation (Non-Medical): Not on file  Physical Activity:   . Days of Exercise per Week: Not on file  . Minutes of Exercise per Session: Not on file  Stress:   . Feeling of Stress : Not on file  Social Connections:   . Frequency of Communication with Friends and Family: Not on file  . Frequency of Social Gatherings with Friends and Family: Not on file  . Attends Religious Services: Not on file  . Active Member of Clubs or Organizations: Not on file  . Attends Archivist Meetings: Not on file  . Marital Status: Not on file  Intimate Partner Violence:   . Fear of Current or Ex-Partner: Not on file  . Emotionally Abused: Not on file  . Physically Abused: Not on file  . Sexually Abused: Not on file   Social History   Tobacco Use  Smoking Status Current Every Day Smoker  . Packs/day: 0.50  . Types: Cigarettes  Smokeless Tobacco Never Used   Social History   Substance and Sexual Activity  Alcohol Use No  . Alcohol/week: 0.0 standard drinks    Family History:  Family History  Problem Relation Age of Onset  . Dementia Mother   . Thyroid disease Mother   . Hypertension Mother   . Arthritis Mother   . Diabetes Mother   . Diabetes Father     Past medical history, surgical history, medications, allergies, family history and social history reviewed with patient today and changes made  to appropriate areas of the chart.   Review of Systems - negative All other ROS negative except what is listed above and in the HPI.      Objective:    BP 139/79   Pulse 94   Temp 98.4 F (36.9 C) (Oral)   Ht 4' 11.76" (1.518 m)   Wt 139 lb 6.4 oz (63.2 kg)  LMP  (LMP Unknown)   SpO2 98%   BMI 27.44 kg/m   Wt Readings from Last 3 Encounters:  04/14/20 139 lb 6.4 oz (63.2 kg)  04/01/19 144 lb (65.3 kg)  03/18/19 141 lb (64 kg)    Physical Exam Constitutional:      General: She is awake. She is not in acute distress.    Appearance: She is well-developed. She is not ill-appearing.  HENT:     Head: Normocephalic and atraumatic.     Right Ear: Hearing, tympanic membrane, ear canal and external ear normal. No drainage.     Left Ear: Hearing, tympanic membrane, ear canal and external ear normal. No drainage.     Nose: Nose normal.     Right Sinus: No maxillary sinus tenderness or frontal sinus tenderness.     Left Sinus: No maxillary sinus tenderness or frontal sinus tenderness.     Mouth/Throat:     Mouth: Mucous membranes are moist.     Pharynx: Oropharynx is clear. Uvula midline. No pharyngeal swelling, oropharyngeal exudate or posterior oropharyngeal erythema.  Eyes:     General: Lids are normal.        Right eye: No discharge.        Left eye: No discharge.     Extraocular Movements: Extraocular movements intact.     Conjunctiva/sclera: Conjunctivae normal.     Pupils: Pupils are equal, round, and reactive to light.     Visual Fields: Right eye visual fields normal and left eye visual fields normal.  Neck:     Thyroid: No thyromegaly.     Vascular: No carotid bruit.     Trachea: Trachea normal.  Cardiovascular:     Rate and Rhythm: Normal rate and regular rhythm.     Heart sounds: Normal heart sounds. No murmur heard.  No gallop.   Pulmonary:     Effort: Pulmonary effort is normal. No accessory muscle usage or respiratory distress.     Breath sounds: Normal  breath sounds.  Chest:     Comments: Refuses exam today. Abdominal:     General: Bowel sounds are normal.     Palpations: Abdomen is soft. There is no hepatomegaly or splenomegaly.     Tenderness: There is no abdominal tenderness.  Genitourinary:    Comments: Refuses exam. Musculoskeletal:        General: Normal range of motion.     Cervical back: Normal range of motion and neck supple.     Right lower leg: No edema.     Left lower leg: No edema.  Lymphadenopathy:     Head:     Right side of head: No submental, submandibular, tonsillar, preauricular or posterior auricular adenopathy.     Left side of head: No submental, submandibular, tonsillar, preauricular or posterior auricular adenopathy.     Cervical: No cervical adenopathy.  Skin:    General: Skin is warm and dry.     Capillary Refill: Capillary refill takes less than 2 seconds.     Findings: No rash.  Neurological:     Mental Status: She is alert and oriented to person, place, and time.     Cranial Nerves: Cranial nerves are intact.     Gait: Gait is intact.     Deep Tendon Reflexes: Reflexes are normal and symmetric.     Reflex Scores:      Brachioradialis reflexes are 2+ on the right side and 2+ on the left side.      Patellar  reflexes are 2+ on the right side and 2+ on the left side. Psychiatric:        Attention and Perception: Attention normal.        Mood and Affect: Mood normal.        Speech: Speech normal.        Behavior: Behavior normal. Behavior is cooperative.        Thought Content: Thought content normal.        Judgment: Judgment normal.     Results for orders placed or performed in visit on 04/01/19  CBC with Differential/Platelet out  Result Value Ref Range   WBC 6.3 3.4 - 10.8 x10E3/uL   RBC 4.92 3.77 - 5.28 x10E6/uL   Hemoglobin 16.3 (H) 11.1 - 15.9 g/dL   Hematocrit 48.8 (H) 34.0 - 46.6 %   MCV 99 (H) 79 - 97 fL   MCH 33.1 (H) 26.6 - 33.0 pg   MCHC 33.4 31 - 35 g/dL   RDW 12.6 11.7 - 15.4  %   Platelets 254 150 - 450 x10E3/uL   Neutrophils 68 Not Estab. %   Lymphs 22 Not Estab. %   Monocytes 6 Not Estab. %   Eos 3 Not Estab. %   Basos 1 Not Estab. %   Neutrophils Absolute 4.3 1.40 - 7.00 x10E3/uL   Lymphocytes Absolute 1.4 0 - 3 x10E3/uL   Monocytes Absolute 0.4 0 - 0 x10E3/uL   EOS (ABSOLUTE) 0.2 0.0 - 0.4 x10E3/uL   Basophils Absolute 0.1 0 - 0 x10E3/uL   Immature Granulocytes 0 Not Estab. %   Immature Grans (Abs) 0.0 0.0 - 0.1 x10E3/uL  Comprehensive metabolic panel  Result Value Ref Range   Glucose 102 (H) 65 - 99 mg/dL   BUN 10 8 - 27 mg/dL   Creatinine, Ser 0.66 0.57 - 1.00 mg/dL   GFR calc non Af Amer 92 >59 mL/min/1.73   GFR calc Af Amer 106 >59 mL/min/1.73   BUN/Creatinine Ratio 15 12 - 28   Sodium 142 134 - 144 mmol/L   Potassium 4.4 3.5 - 5.2 mmol/L   Chloride 101 96 - 106 mmol/L   CO2 23 20 - 29 mmol/L   Calcium 10.4 (H) 8.7 - 10.3 mg/dL   Total Protein 7.7 6.0 - 8.5 g/dL   Albumin 4.6 3.8 - 4.8 g/dL   Globulin, Total 3.1 1.5 - 4.5 g/dL   Albumin/Globulin Ratio 1.5 1.2 - 2.2   Bilirubin Total 0.2 0.0 - 1.2 mg/dL   Alkaline Phosphatase 86 39 - 117 IU/L   AST 13 0 - 40 IU/L   ALT 11 0 - 32 IU/L  Lipid Panel w/o Chol/HDL Ratio out  Result Value Ref Range   Cholesterol, Total 257 (H) 100 - 199 mg/dL   Triglycerides 148 0 - 149 mg/dL   HDL 60 >39 mg/dL   VLDL Cholesterol Cal 27 5 - 40 mg/dL   LDL Chol Calc (NIH) 170 (H) 0 - 99 mg/dL  TSH  Result Value Ref Range   TSH 1.760 0.450 - 4.500 uIU/mL  Vit D  25 hydroxy (rtn osteoporosis monitoring)  Result Value Ref Range   Vit D, 25-Hydroxy 41.2 30.0 - 100.0 ng/mL      Assessment & Plan:   Problem List Items Addressed This Visit      Digestive   IBS (irritable bowel syndrome)    Chronic, ongoing issue.  Has tried all OTC options without success and diet changes.  Ongoing issues since childhood  with constipation.  Will trial Lubiprostone, start out at 8 MCG BID for IBS-C.  Return in 6 months or  sooner if no improvement.      Relevant Medications   lubiprostone (AMITIZA) 8 MCG capsule   hyoscyamine (LEVBID) 0.375 MG 12 hr tablet     Other   Menopause    Chronic, stable on current medication regimen (compounded Progesterone 125MG daily).  Continue current regimen.  Script to be faxed to pharmacy in [place].  She is aware of long term risks of hormone use and refuses to taper off.      Depression    Chronic, stable on current medication regimen.  Continue current regimen and adjust as needed.  Benefits both mood and sleep pattern.  Refills sent.      Relevant Medications   amitriptyline (ELAVIL) 25 MG tablet   Nicotine dependence, cigarettes, uncomplicated    I have recommended complete cessation of tobacco use. I have discussed various options available for assistance with tobacco cessation including over the counter methods (Nicotine gum, patch and lozenges). We also discussed prescription options (Chantix, Nicotine Inhaler / Nasal Spray). The patient is not interested in pursuing any prescription tobacco cessation options at this time.       Elevated LDL cholesterol level - Primary    Chronic with ASCVD 16%, refuses statin medication.  Lipid panel today and continue to discuss.      Relevant Orders   Comp Met (CMET)   Lipid Profile    Other Visit Diagnoses    Encounter for annual physical exam       Annual labs today to include CBC, CMP, lipid, TSH   Relevant Orders   CBC   Thyroid disorder screen       TSH on labs today   Relevant Orders   Thyroid Panel With TSH   Vitamin D deficiency       History of low levels, recheck today and start supplement as needed.   Relevant Orders   VITAMIN D 25 Hydroxy (Vit-D Deficiency, Fractures)   B12 deficiency       History of low levels, recheck today and start supplement as needed.   Relevant Orders   Vitamin B12       Follow up plan: Return in about 1 year (around 04/14/2021) for Annual Exam.   LABORATORY TESTING:   - Pap smear: refused  IMMUNIZATIONS:   - Tdap: Tetanus vaccination status reviewed: last tetanus booster within 10 years. - Influenza: Refused - Pneumovax: Refused - Prevnar: Refused - HPV: Not applicable - Zostavax vaccine: Up to date  SCREENING: -Mammogram: Refused  - Colonoscopy: Refused  - Bone Density: Refused  -Hearing Test: Not applicable  -Spirometry: Not applicable   PATIENT COUNSELING:   Advised to take 1 mg of folate supplement per day if capable of pregnancy.   Sexuality: Discussed sexually transmitted diseases, partner selection, use of condoms, avoidance of unintended pregnancy  and contraceptive alternatives.   Advised to avoid cigarette smoking.  I discussed with the patient that most people either abstain from alcohol or drink within safe limits (<=14/week and <=4 drinks/occasion for males, <=7/weeks and <= 3 drinks/occasion for females) and that the risk for alcohol disorders and other health effects rises proportionally with the number of drinks per week and how often a drinker exceeds daily limits.  Discussed cessation/primary prevention of drug use and availability of treatment for abuse.   Diet: Encouraged to adjust caloric intake to maintain  or achieve ideal body  weight, to reduce intake of dietary saturated fat and total fat, to limit sodium intake by avoiding high sodium foods and not adding table salt, and to maintain adequate dietary potassium and calcium preferably from fresh fruits, vegetables, and low-fat dairy products.    stressed the importance of regular exercise  Injury prevention: Discussed safety belts, safety helmets, smoke detector, smoking near bedding or upholstery.   Dental health: Discussed importance of regular tooth brushing, flossing, and dental visits.    NEXT PREVENTATIVE PHYSICAL DUE IN 1 YEAR. Return in about 1 year (around 04/14/2021) for Annual Exam.

## 2020-04-14 NOTE — Assessment & Plan Note (Signed)
Chronic with ASCVD 16%, refuses statin medication.  Lipid panel today and continue to discuss.

## 2020-04-15 ENCOUNTER — Encounter: Payer: Self-pay | Admitting: Nurse Practitioner

## 2020-04-15 ENCOUNTER — Other Ambulatory Visit: Payer: Self-pay | Admitting: Nurse Practitioner

## 2020-04-15 DIAGNOSIS — E538 Deficiency of other specified B group vitamins: Secondary | ICD-10-CM

## 2020-04-15 LAB — CBC
Hematocrit: 44 % (ref 34.0–46.6)
Hemoglobin: 15.4 g/dL (ref 11.1–15.9)
MCH: 34.8 pg — ABNORMAL HIGH (ref 26.6–33.0)
MCHC: 35 g/dL (ref 31.5–35.7)
MCV: 99 fL — ABNORMAL HIGH (ref 79–97)
Platelets: 228 10*3/uL (ref 150–450)
RBC: 4.43 x10E6/uL (ref 3.77–5.28)
RDW: 12.7 % (ref 11.7–15.4)
WBC: 6.3 10*3/uL (ref 3.4–10.8)

## 2020-04-15 LAB — COMPREHENSIVE METABOLIC PANEL
ALT: 9 IU/L (ref 0–32)
AST: 13 IU/L (ref 0–40)
Albumin/Globulin Ratio: 1.5 (ref 1.2–2.2)
Albumin: 4.3 g/dL (ref 3.8–4.8)
Alkaline Phosphatase: 80 IU/L (ref 44–121)
BUN/Creatinine Ratio: 18 (ref 12–28)
BUN: 14 mg/dL (ref 8–27)
Bilirubin Total: 0.2 mg/dL (ref 0.0–1.2)
CO2: 28 mmol/L (ref 20–29)
Calcium: 10.4 mg/dL — ABNORMAL HIGH (ref 8.7–10.3)
Chloride: 102 mmol/L (ref 96–106)
Creatinine, Ser: 0.8 mg/dL (ref 0.57–1.00)
GFR calc Af Amer: 88 mL/min/{1.73_m2} (ref >59)
GFR calc non Af Amer: 76 mL/min/{1.73_m2} (ref >59)
Globulin, Total: 2.8 g/dL (ref 1.5–4.5)
Glucose: 89 mg/dL (ref 65–99)
Potassium: 4.2 mmol/L (ref 3.5–5.2)
Sodium: 142 mmol/L (ref 134–144)
Total Protein: 7.1 g/dL (ref 6.0–8.5)

## 2020-04-15 LAB — LIPID PANEL
Chol/HDL Ratio: 4.6 ratio — ABNORMAL HIGH (ref 0.0–4.4)
Cholesterol, Total: 231 mg/dL — ABNORMAL HIGH (ref 100–199)
HDL: 50 mg/dL (ref >39)
LDL Chol Calc (NIH): 147 mg/dL — ABNORMAL HIGH (ref 0–99)
Triglycerides: 186 mg/dL — ABNORMAL HIGH (ref 0–149)
VLDL Cholesterol Cal: 34 mg/dL (ref 5–40)

## 2020-04-15 LAB — VITAMIN B12: Vitamin B-12: 152 pg/mL — ABNORMAL LOW (ref 232–1245)

## 2020-04-15 LAB — THYROID PANEL WITH TSH
Free Thyroxine Index: 2.1 (ref 1.2–4.9)
T3 Uptake Ratio: 23 % — ABNORMAL LOW (ref 24–39)
T4, Total: 9 ug/dL (ref 4.5–12.0)
TSH: 2.49 u[IU]/mL (ref 0.450–4.500)

## 2020-04-15 NOTE — Progress Notes (Signed)
Contacted via MyChart The 10-year ASCVD risk score Denman [name] DC Jr., et al., 2013) is: 16.2%   Values used to calculate the score:     Age: 69 years     Sex: Female     Is Non-Hispanic African American: No     Diabetic: No     Tobacco smoker: Yes     Systolic Blood Pressure: 139 mmHg     Is BP treated: No     HDL Cholesterol: 50 mg/dL     Total Cholesterol: 231 mg/dL   Good evening Malaak, your labs have returned. CBC, thyroid, kidney, and liver function are all within normal ranges.  Cholesterol levels are elevated with continued recommendation to start cholesterol medication for this.  Is this something you would like to start?  It would be beneficial for stroke prevention.  Your B12 level is quite low, for this I recommend starting Vitamin B12 1000 MCG daily.  You can obtain this over the counter in the vitamin section and I would like you to come in 8 weeks for lab recheck only, please schedule this, to ensure oral supplement if helping this.  Any questions? Keep being awesome!!  Thank you for allowing me to participate in your care. Kindest regards, Jelesa Mangini

## 2020-09-28 ENCOUNTER — Encounter: Payer: Self-pay | Admitting: Nurse Practitioner

## 2020-09-28 ENCOUNTER — Other Ambulatory Visit: Payer: Self-pay

## 2020-09-28 ENCOUNTER — Ambulatory Visit
Admission: RE | Admit: 2020-09-28 | Discharge: 2020-09-28 | Disposition: A | Discharge status: Home / Self Care | Payer: Medicare Other | Source: Ambulatory Visit | Attending: Nurse Practitioner | Admitting: Nurse Practitioner

## 2020-09-28 ENCOUNTER — Ambulatory Visit
Admission: RE | Admit: 2020-09-28 | Discharge: 2020-09-28 | Disposition: A | Discharge status: Home / Self Care | Payer: Medicare Other | Attending: Nurse Practitioner | Admitting: Nurse Practitioner

## 2020-09-28 ENCOUNTER — Ambulatory Visit (INDEPENDENT_AMBULATORY_CARE_PROVIDER_SITE_OTHER): Payer: Medicare Other | Admitting: Nurse Practitioner

## 2020-09-28 VITALS — BP 138/77 | HR 81 | Temp 99.6°F | Wt 141.2 lb

## 2020-09-28 DIAGNOSIS — M79672 Pain in left foot: Secondary | ICD-10-CM | POA: Insufficient documentation

## 2020-09-28 DIAGNOSIS — S92352A Displaced fracture of fifth metatarsal bone, left foot, initial encounter for closed fracture: Secondary | ICD-10-CM | POA: Diagnosis not present

## 2020-09-28 NOTE — Addendum Note (Signed)
Addended by: Aura Dials T on: 09/28/2020 09:44 AM   Modules accepted: Orders

## 2020-09-28 NOTE — Assessment & Plan Note (Addendum)
Acute x 2-3 weeks since trauma to area.  High suspicion for fracture above 4th and 5th toes.  Will obtain imaging and if fracture present send to ortho ASAP as may require boot or intervention to assist with healing, discussed with patient.  At this time recommend to continue ice at home and elevation + rest.  May take Tylenol as needed for pain.  If fracture present highly recommended to her to obtain DEXA to assess for osteoporosis.  Return in 2 weeks, sooner if worsening symptoms.

## 2020-09-28 NOTE — Progress Notes (Signed)
BP 138/77   Pulse 81   Temp 99.6 F (37.6 C) (Oral)   Wt 141 lb 3.2 oz (64 kg)   LMP  (LMP Unknown)   SpO2 97%   BMI 27.79 kg/m    Subjective:    Patient ID: Priscilla [name], female    DOB: 04-23-1951, 70 y.o.   MRN: [id number]  HPI: Priscilla [name] is a 70 y.o. female  Chief Complaint  Patient presents with  . Foot Injury    Patient states she was just walking and she just fell about three weeks ago this upcoming Thursday. Patient states her left foot has bruised, but has gotten better. Notices the swelling will not go down and it has left a red mark.    FOOT PAIN About 2-3 weeks ago was casually walking in gravel at work, stepped wrong, and fell down.  The strap on shoe dug into left foot.  She does not recall if foot twisted, but since then has had swelling and pain.  Most painful to dorsal aspect above 5th and 4th toe. Duration: weeks Involved foot: left Mechanism of injury: trauma Location:  dorsal aspect above 5th and 4th toe Onset: sudden  Severity: 5/10  Quality:  dull, aching and throbbing Frequency: intermittent Radiation: no Aggravating factors: weight bearing, walking and movement  Alleviating factors: ice and Tylenol Status: fluctuating Treatments attempted: rest, ice and APAP  Relief with NSAIDs?:  No NSAIDs Taken Weakness with weight bearing or walking: no Morning stiffness: no Swelling: yes Redness: yes Bruising: yes Paresthesias / decreased sensation: no  Fevers:no  Relevant past medical, surgical, family and social history reviewed and updated as indicated. Interim medical history since our last visit reviewed. Allergies and medications reviewed and updated.  Review of Systems  Constitutional: Negative for activity change, appetite change, diaphoresis, fatigue and fever.  Respiratory: Negative for cough, chest tightness and shortness of breath.   Cardiovascular: Negative for chest pain, palpitations and leg swelling.  Musculoskeletal: Positive for  arthralgias.  Neurological: Negative.   Psychiatric/Behavioral: Negative.     Per HPI unless specifically indicated above     Objective:    BP 138/77   Pulse 81   Temp 99.6 F (37.6 C) (Oral)   Wt 141 lb 3.2 oz (64 kg)   LMP  (LMP Unknown)   SpO2 97%   BMI 27.79 kg/m   Wt Readings from Last 3 Encounters:  09/28/20 141 lb 3.2 oz (64 kg)  04/14/20 139 lb 6.4 oz (63.2 kg)  04/01/19 144 lb (65.3 kg)    Physical Exam Vitals and nursing note reviewed.  Constitutional:      General: She is awake. She is not in acute distress.    Appearance: She is well-developed and well-groomed. She is not ill-appearing or toxic-appearing.  HENT:     Head: Normocephalic.     Right Ear: Hearing normal.     Left Ear: Hearing normal.  Eyes:     General: Lids are normal.        Right eye: No discharge.        Left eye: No discharge.     Conjunctiva/sclera: Conjunctivae normal.     Pupils: Pupils are equal, round, and reactive to light.  Neck:     Thyroid: No thyromegaly.     Vascular: No carotid bruit.  Cardiovascular:     Rate and Rhythm: Normal rate and regular rhythm.     Pulses:  Dorsalis pedis pulses are 2+ on the right side and 2+ on the left side.       Posterior tibial pulses are 2+ on the right side and 2+ on the left side.     Heart sounds: Normal heart sounds. No murmur heard. No gallop.   Pulmonary:     Effort: Pulmonary effort is normal. No accessory muscle usage or respiratory distress.     Breath sounds: Normal breath sounds.  Abdominal:     General: Bowel sounds are normal.     Palpations: Abdomen is soft. There is no hepatomegaly or splenomegaly.  Musculoskeletal:     Cervical back: Normal range of motion and neck supple.     Right lower leg: No edema.     Left lower leg: No edema.     Right foot: Normal range of motion.     Left foot: Normal range of motion.  Feet:     Right foot:     Protective Sensation: 10 sites tested. 10 sites sensed.     Toenail  Condition: Right toenails are normal.     Left foot:     Protective Sensation: 10 sites tested. 10 sites sensed.     Skin integrity: Erythema present.     Toenail Condition: Left toenails are normal.     Comments: Along 1st metatarsal above 4th and 5th toes tenderness present and area with edema and mild bruising, tenderness extends along lateral aspect left foot.  No tenderness to ankle.  Edema 1+ to left foot.  No warmth.  Right foot normal. Lymphadenopathy:     Cervical: No cervical adenopathy.  Skin:    General: Skin is warm and dry.  Neurological:     Mental Status: She is alert and oriented to person, place, and time.  Psychiatric:        Attention and Perception: Attention normal.        Mood and Affect: Mood normal.        Speech: Speech normal.        Behavior: Behavior normal. Behavior is cooperative.        Thought Content: Thought content normal.    Results for orders placed or performed in visit on 04/14/20  CBC  Result Value Ref Range   WBC 6.3 3.4 - 10.8 x10E3/uL   RBC 4.43 3.77 - 5.28 x10E6/uL   Hemoglobin 15.4 11.1 - 15.9 g/dL   Hematocrit 44.0 34.0 - 46.6 %   MCV 99 (H) 79 - 97 fL   MCH 34.8 (H) 26.6 - 33.0 pg   MCHC 35.0 31.5 - 35.7 g/dL   RDW 12.7 11.7 - 15.4 %   Platelets 228 150 - 450 x10E3/uL  Comp Met (CMET)  Result Value Ref Range   Glucose 89 65 - 99 mg/dL   BUN 14 8 - 27 mg/dL   Creatinine, Ser 0.80 0.57 - 1.00 mg/dL   GFR calc non Af Amer 76 >59 mL/min/1.73   GFR calc Af Amer 88 >59 mL/min/1.73   BUN/Creatinine Ratio 18 12 - 28   Sodium 142 134 - 144 mmol/L   Potassium 4.2 3.5 - 5.2 mmol/L   Chloride 102 96 - 106 mmol/L   CO2 28 20 - 29 mmol/L   Calcium 10.4 (H) 8.7 - 10.3 mg/dL   Total Protein 7.1 6.0 - 8.5 g/dL   Albumin 4.3 3.8 - 4.8 g/dL   Globulin, Total 2.8 1.5 - 4.5 g/dL   Albumin/Globulin Ratio 1.5 1.2 - 2.2  Bilirubin Total 0.2 0.0 - 1.2 mg/dL   Alkaline Phosphatase 80 44 - 121 IU/L   AST 13 0 - 40 IU/L   ALT 9 0 - 32 IU/L   Lipid Profile  Result Value Ref Range   Cholesterol, Total 231 (H) 100 - 199 mg/dL   Triglycerides 186 (H) 0 - 149 mg/dL   HDL 50 >39 mg/dL   VLDL Cholesterol Cal 34 5 - 40 mg/dL   LDL Chol Calc (NIH) 147 (H) 0 - 99 mg/dL   Chol/HDL Ratio 4.6 (H) 0.0 - 4.4 ratio  Thyroid Panel With TSH  Result Value Ref Range   TSH 2.490 0.450 - 4.500 uIU/mL   T4, Total 9.0 4.5 - 12.0 ug/dL   T3 Uptake Ratio 23 (L) 24 - 39 %   Free Thyroxine Index 2.1 1.2 - 4.9  Vitamin B12  Result Value Ref Range   Vitamin B-12 152 (L) 232 - 1,245 pg/mL      Assessment & Plan:   Problem List Items Addressed This Visit      Other   Left foot pain - Primary    Acute x 2-3 weeks since trauma to area.  High suspicion for fracture above 4th and 5th toes.  Will obtain imaging and if fracture present send to ortho ASAP as may require boot or intervention to assist with healing, discussed with patient.  At this time recommend to continue ice at home and elevation + rest.  May take Tylenol as needed for pain.  If fracture present highly recommended to her to obtain DEXA to assess for osteoporosis.  Return in 2 weeks, sooner if worsening symptoms.      Relevant Orders   DG Foot Complete Left       Follow up plan: Return in about 2 weeks (around 10/12/2020) for Left foot pain.

## 2020-09-28 NOTE — Patient Instructions (Signed)

## 2020-09-29 NOTE — Progress Notes (Signed)
Patient aware and has been at ortho

## 2020-10-06 DIAGNOSIS — S92352A Displaced fracture of fifth metatarsal bone, left foot, initial encounter for closed fracture: Secondary | ICD-10-CM | POA: Diagnosis not present

## 2020-10-14 ENCOUNTER — Ambulatory Visit: Payer: Medicare Other | Admitting: Nurse Practitioner

## 2020-11-02 NOTE — Addendum Note (Signed)
Encounter addended by: Assunta Gambles, RT on: 11/02/2020 3:00 PM  Actions taken: Imaging Exam begun, Image imported

## 2020-11-03 DIAGNOSIS — S92352A Displaced fracture of fifth metatarsal bone, left foot, initial encounter for closed fracture: Secondary | ICD-10-CM | POA: Diagnosis not present

## 2020-12-29 DIAGNOSIS — S92352A Displaced fracture of fifth metatarsal bone, left foot, initial encounter for closed fracture: Secondary | ICD-10-CM | POA: Diagnosis not present

## 2021-01-25 ENCOUNTER — Telehealth: Payer: Self-pay

## 2021-01-25 NOTE — Telephone Encounter (Signed)
Left patient a VM requesting she call back to schedule Medicare AWV

## 2021-01-30 ENCOUNTER — Telehealth: Payer: Self-pay

## 2021-01-30 ENCOUNTER — Ambulatory Visit (INDEPENDENT_AMBULATORY_CARE_PROVIDER_SITE_OTHER): Payer: Medicare Other

## 2021-01-30 DIAGNOSIS — Z0000 Encounter for general adult medical examination without abnormal findings: Secondary | ICD-10-CM

## 2021-01-30 NOTE — Telephone Encounter (Signed)
Called patient this morning for AWV -  unable to reach patient on phone after 3 attempts left message to call PCP to reschedule her appointment

## 2021-02-03 ENCOUNTER — Ambulatory Visit (INDEPENDENT_AMBULATORY_CARE_PROVIDER_SITE_OTHER): Payer: Medicare Other

## 2021-02-03 DIAGNOSIS — Z1231 Encounter for screening mammogram for malignant neoplasm of breast: Secondary | ICD-10-CM | POA: Diagnosis not present

## 2021-02-03 DIAGNOSIS — Z0000 Encounter for general adult medical examination without abnormal findings: Secondary | ICD-10-CM

## 2021-02-03 DIAGNOSIS — Z13820 Encounter for screening for osteoporosis: Secondary | ICD-10-CM | POA: Diagnosis not present

## 2021-02-03 NOTE — Progress Notes (Signed)
Subjective:   Priscilla [name] is a 70 y.o. female who presents for Medicare Annual (Subsequent) preventive examination.  I connected with  Priscilla [name] on 02/03/21 by an audio only telemedicine application and verified that I am speaking with the correct person using two identifiers.   I discussed the limitations, risks, security and privacy concerns of performing an evaluation and management service by telephone and the availability of in person appointments. I also discussed with the patient that there may be a patient responsible charge related to this service. The patient expressed understanding and verbally consented to this telephonic visit.  Location of Patient: home Location of Provider: office  List any persons and their role that are participating in the visit with the patient.    Review of Systems    Defer to PCP.   Cardiac Risk Factors include: none     Objective:    Today's Vitals   02/03/21 1752  PainSc: 0-No pain   There is no height or weight on file to calculate BMI.  Advanced Directives 02/03/2021 02/12/2017  Does Patient Have a Medical Advance Directive? No No  Would patient like information on creating a medical advance directive? No - Patient declined -    Current Medications (verified) Outpatient Encounter Medications as of 02/03/2021  Medication Sig   amitriptyline (ELAVIL) 25 MG tablet Take 2 tablets (50 mg total) by mouth at bedtime.   cholecalciferol (VITAMIN D) 1000 UNITS tablet Take by mouth. Take 2 capsules once daily   hyoscyamine (LEVBID) 0.375 MG 12 hr tablet Take 1 tablet (0.375 mg total) by mouth every 12 (twelve) hours as needed.   NONFORMULARY OR COMPOUNDED ITEM Take 125 mg by mouth daily. Progesterone from [facility]   lubiprostone (AMITIZA) 8 MCG capsule Take 1 capsule (8 mcg total) by mouth 2 (two) times daily with a meal. (Patient not taking: Reported on 02/03/2021)   No facility-administered encounter medications on file as of  02/03/2021.    Allergies (verified) Patient has no known allergies.   History: Past Medical History:  Diagnosis Date   Anxiety    Depression    Menopause    Past Surgical History:  Procedure Laterality Date   CHOLECYSTECTOMY     ovarian cyst rupture     Family History  Problem Relation Age of Onset   Dementia Mother    Thyroid disease Mother    Hypertension Mother    Arthritis Mother    Diabetes Mother    Diabetes Father    Social History   Socioeconomic History   Marital status: Married    Spouse name: Not on file   Number of children: Not on file   Years of education: Not on file   Highest education level: Not on file  Occupational History   Not on file  Tobacco Use   Smoking status: Every Day    Packs/day: 0.50    Types: Cigarettes   Smokeless tobacco: Never  Vaping Use   Vaping Use: Never used  Substance and Sexual Activity   Alcohol use: No    Alcohol/week: 0.0 standard drinks   Drug use: No   Sexual activity: Yes  Other Topics Concern   Not on file  Social History Narrative   Not on file   Social Determinants of Health   Financial Resource Strain: Low Risk    Difficulty of Paying Living Expenses: Not hard at all  Food Insecurity: No Food Insecurity   Worried About Running Out of  Food in the Last Year: Never true   Ran Out of Food in the Last Year: Never true  Transportation Needs: No Transportation Needs   Lack of Transportation (Medical): No   Lack of Transportation (Non-Medical): No  Physical Activity: Sufficiently Active   Days of Exercise per Week: 6 days   Minutes of Exercise per Session: 30 min  Stress: No Stress Concern Present   Feeling of Stress : Not at all  Social Connections: Moderately Isolated   Frequency of Communication with Friends and Family: Twice a week   Frequency of Social Gatherings with Friends and Family: Once a week   Attends Religious Services: Never   Database administrator or Organizations: No   Attends Probation officer: Never   Marital Status: Married    Tobacco Counseling Ready to quit: Not Answered Counseling given: Not Answered   Clinical Intake:  Pre-visit preparation completed: Yes  Pain : No/denies pain Pain Score: 0-No pain     Nutritional Risks: Other (Comment) Diabetes: No  How often do you need to have someone help you when you read instructions, pamphlets, or other written materials from your doctor or pharmacy?: 1 - Never What is the last grade level you completed in school?: 12th grade  Diabetic?No  Interpreter Needed?: No      Activities of Daily Living In your present state of health, do you have any difficulty performing the following activities: 02/03/2021 04/14/2020  Hearing? N Y  Vision? N N  Difficulty concentrating or making decisions? N N  Walking or climbing stairs? N N  Dressing or bathing? N N  Doing errands, shopping? N N  Preparing Food and eating ? N -  Using the Toilet? N -  In the past six months, have you accidently leaked urine? N -  Do you have problems with loss of bowel control? N -  Managing your Medications? N -  Housekeeping or managing your Housekeeping? N -  Some recent data might be hidden    Patient Care Team: Marjie Skiff, NP as PCP - General (Nurse Practitioner)  Indicate any recent Medical Services you may have received from other than [facility] providers in the past year (date may be approximate).     Assessment:   This is a routine wellness examination for Priscilla [name].  Hearing/Vision screen No results found.  Dietary issues and exercise activities discussed: Current Exercise Habits: Home exercise routine, Type of exercise: Other - see comments (exercise machine), Time (Minutes): 30, Frequency (Times/Week): 6, Weekly Exercise (Minutes/Week): 180, Intensity: Mild, Exercise limited by: None identified   Goals Addressed   None   Depression Screen PHQ 2/9 Scores 02/03/2021 04/14/2020 04/14/2020 04/01/2019  02/19/2018 02/12/2017 02/09/2016  PHQ - 2 Score 0 0 0 0 0 0 0  PHQ- 9 Score - 2 - 0 0 0 -    Fall Risk Fall Risk  02/03/2021 04/14/2020 04/01/2019 03/18/2019 03/13/2019  Falls in the past year? 1 0 0 0 0  Number falls in past yr: 0 0 0 0 0  Injury with Fall? 0 0 0 0 0  Risk for fall due to : No Fall Risks - - - -  Follow up Falls evaluation completed Falls evaluation completed Falls evaluation completed - -    FALL RISK PREVENTION PERTAINING TO THE HOME:  Any stairs in or around the home? Yes  If so, are there any without handrails? No  Home free of loose throw rugs in walkways, pet beds,  electrical cords, etc? Yes  Adequate lighting in your home to reduce risk of falls? Yes   ASSISTIVE DEVICES UTILIZED TO PREVENT FALLS:  Life alert? No  Use of a cane, walker or w/c? No  Grab bars in the bathroom? No  Shower chair or bench in shower? Yes  Elevated toilet seat or a handicapped toilet? Yes   TIMED UP AND GO:  Was the test performed? No .  Length of time to ambulate 10 feet: N/A sec.     Cognitive Function:     6CIT Screen 02/03/2021  What Year? 0 points  What month? 0 points  What time? 0 points  Count back from 20 0 points  Months in reverse 0 points  Repeat phrase 0 points  Total Score 0    Immunizations Immunization History  Administered Date(s) Administered   Td 05/08/2004   Zoster, Live 10/25/2012    TDAP status: Due, Education has been provided regarding the importance of this vaccine. Advised may receive this vaccine at local pharmacy or Health Dept. Aware to provide a copy of the vaccination record if obtained from local pharmacy or Health Dept. Verbalized acceptance and understanding.  Flu Vaccine status: Declined, Education has been provided regarding the importance of this vaccine but patient still declined. Advised may receive this vaccine at local pharmacy or Health Dept. Aware to provide a copy of the vaccination record if obtained from local pharmacy or  Health Dept. Verbalized acceptance and understanding.  Pneumococcal vaccine status: Declined,  Education has been provided regarding the importance of this vaccine but patient still declined. Advised may receive this vaccine at local pharmacy or Health Dept. Aware to provide a copy of the vaccination record if obtained from local pharmacy or Health Dept. Verbalized acceptance and understanding.   Covid-19 vaccine status: Declined, Education has been provided regarding the importance of this vaccine but patient still declined. Advised may receive this vaccine at local pharmacy or Health Dept.or vaccine clinic. Aware to provide a copy of the vaccination record if obtained from local pharmacy or Health Dept. Verbalized acceptance and understanding.  Qualifies for Shingles Vaccine? Yes   Zostavax completed No   Shingrix Completed?: No.    Education has been provided regarding the importance of this vaccine. Patient has been advised to call insurance company to determine out of pocket expense if they have not yet received this vaccine. Advised may also receive vaccine at local pharmacy or Health Dept. Verbalized acceptance and understanding.  Screening Tests Health Maintenance  Topic Date Due   COVID-19 Vaccine (1) Never done   Zoster Vaccines- Shingrix (1 of 2) Never done   MAMMOGRAM  09/26/2013   DEXA SCAN  Never done   COLONOSCOPY (Pts 45-67yrs Insurance coverage will need to be confirmed)  05/08/2017   TETANUS/TDAP  02/25/2018   INFLUENZA VACCINE  Never done   Hepatitis C Screening  Completed   HPV VACCINES  Aged Out    Health Maintenance  Health Maintenance Due  Topic Date Due   COVID-19 Vaccine (1) Never done   Zoster Vaccines- Shingrix (1 of 2) Never done   MAMMOGRAM  09/26/2013   DEXA SCAN  Never done   COLONOSCOPY (Pts 45-86yrs Insurance coverage will need to be confirmed)  05/08/2017   TETANUS/TDAP  02/25/2018   INFLUENZA VACCINE  Never done    Colon Cancer Screening:  Declined  Mammogram status: Ordered 02/03/21. Pt provided with contact info and advised to call to schedule appt.   Bone Density  status: Ordered 02/03/21. Pt provided with contact info and advised to call to schedule appt.  Lung Cancer Screening: (Low Dose CT Chest recommended if Age 41-80 years, 30 pack-year currently smoking OR have quit w/in 15years.) does not qualify.   Lung Cancer Screening Referral: N/A  Additional Screening:  Hepatitis C Screening: does qualify; Completed 02/09/16  Vision Screening: Recommended annual ophthalmology exams for early detection of glaucoma and other disorders of the eye. Is the patient up to date with their annual eye exam?  No  Who is the provider or what is the name of the office in which the patient attends annual eye exams? N/A If pt is not established with a provider, would they like to be referred to a provider to establish care? No .   Dental Screening: Recommended annual dental exams for proper oral hygiene  Community Resource Referral / Chronic Care Management: CRR required this visit?  No   CCM required this visit?  No      Plan:     I have personally reviewed and noted the following in the patient's chart:   Medical and social history Use of alcohol, tobacco or illicit drugs  Current medications and supplements including opioid prescriptions.  Functional ability and status Nutritional status Physical activity Advanced directives List of other physicians Hospitalizations, surgeries, and ER visits in previous 12 months Vitals Screenings to include cognitive, depression, and falls Referrals and appointments  In addition, I have reviewed and discussed with patient certain preventive protocols, quality metrics, and best practice recommendations. A written personalized care plan for preventive services as well as general preventive health recommendations were provided to patient.   Ms. Holten , Thank you for taking time to come  for your Medicare Wellness Visit. I appreciate your ongoing commitment to your health goals. Please review the following plan we discussed and let me know if I can assist you in the future.   These are the goals we discussed:  Goals      Quit smoking / using tobacco        This is a list of the screening recommended for you and due dates:  Health Maintenance  Topic Date Due   COVID-19 Vaccine (1) Never done   Zoster (Shingles) Vaccine (1 of 2) Never done   Mammogram  09/26/2013   DEXA scan (bone density measurement)  Never done   Colon Cancer Screening  05/08/2017   Tetanus Vaccine  02/25/2018   Flu Shot  Never done   Hepatitis C Screening: USPSTF Recommendation to screen - Ages 18-79 yo.  Completed   HPV Vaccine  Aged [address] [place], [place]   02/03/2021   Nurse Notes: Non Face to Face 60 Minutes

## 2021-02-03 NOTE — Patient Instructions (Signed)
Health Maintenance, Female Adopting a healthy lifestyle and getting preventive care are important in promoting health and wellness. Ask your health care provider about: The right schedule for you to have regular tests and exams. Things you can do on your own to prevent diseases and keep yourself healthy. What should I know about diet, weight, and exercise? Eat a healthy diet  Eat a diet that includes plenty of vegetables, fruits, low-fat dairy products, and lean protein. Do not eat a lot of foods that are high in solid fats, added sugars, or sodium. Maintain a healthy weight Body mass index (BMI) is used to identify weight problems. It estimates body fat based on height and weight. Your health care provider can help determine your BMI and help you achieve or maintain a healthy weight. Get regular exercise Get regular exercise. This is one of the most important things you can do for your health. Most adults should: Exercise for at least 150 minutes each week. The exercise should increase your heart rate and make you sweat (moderate-intensity exercise). Do strengthening exercises at least twice a week. This is in addition to the moderate-intensity exercise. Spend less time sitting. Even light physical activity can be beneficial. Watch cholesterol and blood lipids Have your blood tested for lipids and cholesterol at 70 years of age, then have this test every 5 years. Have your cholesterol levels checked more often if: Your lipid or cholesterol levels are high. You are older than 70 years of age. You are at high risk for heart disease. What should I know about cancer screening? Depending on your health history and family history, you may need to have cancer screening at various ages. This may include screening for: Breast cancer. Cervical cancer. Colorectal cancer. Skin cancer. Lung cancer. What should I know about heart disease, diabetes, and high blood pressure? Blood pressure and heart  disease High blood pressure causes heart disease and increases the risk of stroke. This is more likely to develop in people who have high blood pressure readings, are of African descent, or are overweight. Have your blood pressure checked: Every 3-5 years if you are 18-39 years of age. Every year if you are 40 years old or older. Diabetes Have regular diabetes screenings. This checks your fasting blood sugar level. Have the screening done: Once every three years after age 40 if you are at a normal weight and have a low risk for diabetes. More often and at a younger age if you are overweight or have a high risk for diabetes. What should I know about preventing infection? Hepatitis B If you have a higher risk for hepatitis B, you should be screened for this virus. Talk with your health care provider to find out if you are at risk for hepatitis B infection. Hepatitis C Testing is recommended for: Everyone born from 1945 through 1965. Anyone with known risk factors for hepatitis C. Sexually transmitted infections (STIs) Get screened for STIs, including gonorrhea and chlamydia, if: You are sexually active and are younger than 70 years of age. You are older than 70 years of age and your health care provider tells you that you are at risk for this type of infection. Your sexual activity has changed since you were last screened, and you are at increased risk for chlamydia or gonorrhea. Ask your health care provider if you are at risk. Ask your health care provider about whether you are at high risk for HIV. Your health care provider may recommend a prescription medicine   to help prevent HIV infection. If you choose to take medicine to prevent HIV, you should first get tested for HIV. You should then be tested every 3 months for as long as you are taking the medicine. Pregnancy If you are about to stop having your period (premenopausal) and you may become pregnant, seek counseling before you get  pregnant. Take 400 to 800 micrograms (mcg) of folic acid every day if you become pregnant. Ask for birth control (contraception) if you want to prevent pregnancy. Osteoporosis and menopause Osteoporosis is a disease in which the bones lose minerals and strength with aging. This can result in bone fractures. If you are 65 years old or older, or if you are at risk for osteoporosis and fractures, ask your health care provider if you should: Be screened for bone loss. Take a calcium or vitamin D supplement to lower your risk of fractures. Be given hormone replacement therapy (HRT) to treat symptoms of menopause. Follow these instructions at home: Lifestyle Do not use any products that contain nicotine or tobacco, such as cigarettes, e-cigarettes, and chewing tobacco. If you need help quitting, ask your health care provider. Do not use street drugs. Do not share needles. Ask your health care provider for help if you need support or information about quitting drugs. Alcohol use Do not drink alcohol if: Your health care provider tells you not to drink. You are pregnant, may be pregnant, or are planning to become pregnant. If you drink alcohol: Limit how much you use to 0-1 drink a day. Limit intake if you are breastfeeding. Be aware of how much alcohol is in your drink. In the [place] one drink equals one 12 oz bottle of beer (355 mL), one 5 oz glass of wine (148 mL), or one 1 oz glass of hard liquor (44 mL). General instructions Schedule regular health, dental, and eye exams. Stay current with your vaccines. Tell your health care provider if: You often feel depressed. You have ever been abused or do not feel safe at home. Summary Adopting a healthy lifestyle and getting preventive care are important in promoting health and wellness. Follow your health care provider's instructions about healthy diet, exercising, and getting tested or screened for diseases. Follow your health care provider's  instructions on monitoring your cholesterol and blood pressure. This information is not intended to replace advice given to you by your health care provider. Make sure you discuss any questions you have with your health care provider. Document Revised: 07/02/2020 Document Reviewed: 04/17/2018 Elsevier Patient Education  2022 [employer] Inc.  

## 2021-02-22 ENCOUNTER — Encounter: Payer: Self-pay | Admitting: General Surgery

## 2021-03-23 ENCOUNTER — Encounter: Payer: Self-pay | Admitting: Nurse Practitioner

## 2021-03-24 ENCOUNTER — Encounter: Payer: Self-pay | Admitting: Nurse Practitioner

## 2021-03-28 NOTE — Telephone Encounter (Signed)
Appointment scheduled.

## 2021-03-29 ENCOUNTER — Other Ambulatory Visit: Payer: Self-pay

## 2021-03-29 ENCOUNTER — Ambulatory Visit (INDEPENDENT_AMBULATORY_CARE_PROVIDER_SITE_OTHER): Payer: Medicare Other | Admitting: Nurse Practitioner

## 2021-03-29 ENCOUNTER — Encounter: Payer: Self-pay | Admitting: Nurse Practitioner

## 2021-03-29 VITALS — BP 104/72 | HR 85 | Temp 98.2°F | Wt 151.2 lb

## 2021-03-29 DIAGNOSIS — F17210 Nicotine dependence, cigarettes, uncomplicated: Secondary | ICD-10-CM | POA: Diagnosis not present

## 2021-03-29 DIAGNOSIS — E559 Vitamin D deficiency, unspecified: Secondary | ICD-10-CM

## 2021-03-29 DIAGNOSIS — E538 Deficiency of other specified B group vitamins: Secondary | ICD-10-CM | POA: Diagnosis not present

## 2021-03-29 DIAGNOSIS — M545 Low back pain, unspecified: Secondary | ICD-10-CM | POA: Insufficient documentation

## 2021-03-29 DIAGNOSIS — R5383 Other fatigue: Secondary | ICD-10-CM | POA: Insufficient documentation

## 2021-03-29 DIAGNOSIS — F3341 Major depressive disorder, recurrent, in partial remission: Secondary | ICD-10-CM | POA: Diagnosis not present

## 2021-03-29 MED ORDER — LIDOCAINE 5 % EX PTCH: 1.0000 | MEDICATED_PATCH | CUTANEOUS | 0 refills | Status: DC

## 2021-03-29 MED ORDER — TIZANIDINE HCL 2 MG PO CAPS: 2.0000 mg | ORAL_CAPSULE | Freq: Every evening | ORAL | 0 refills | Status: DC | PRN

## 2021-03-29 MED ORDER — BUPROPION HCL ER (XL) 150 MG PO TB24: 150.0000 mg | ORAL_TABLET | Freq: Every day | ORAL | 4 refills | Status: DC

## 2021-03-29 NOTE — Assessment & Plan Note (Signed)
Noted on past labs, is taking oral supplement.  Recheck B12 level today and if remains on lower side consider monthly injections.

## 2021-03-29 NOTE — Assessment & Plan Note (Signed)
Ongoing for over a month, suspect much related to current exacerbation of depression. Refer to depression plan for further.  Labs today CBC, CMP, TSH, B12, Vit D, ANA, CRP, ESR.

## 2021-03-29 NOTE — Assessment & Plan Note (Signed)
Ongoing since having to wear boot in July.  Suspect more muscular, no red flags.  At this time recommend she visit with chiropractor, she is noted to have mild scoliosis on exam and kyphosis R>L.  Will trial Tizanidine to take at night as needed + Lidocaine patches to wear during the day for comfort.  Recommend use of heat at home and rest.  May take Ibuprofen or Tylenol as needed.  Return in 6 weeks.

## 2021-03-29 NOTE — Patient Instructions (Addendum)
Please call to schedule your mammogram and/or bone density: [facility]  Address: [address] [name], [place], [place] [address]  Phone: [phone]   Voltaren gel  Bone Density Test A bone density test uses a type of X-ray to measure the amount of calcium and other minerals in a person's bones. It can measure bone density in the hip and the spine. The test is similar to having a regular X-ray. This test may also be called: Bone densitometry. Bone mineral density test. Dual-energy X-ray absorptiometry (DEXA). You may have this test to: Diagnose a condition that causes weak or thin bones (osteoporosis). Screen you for osteoporosis. Predict your risk for a broken bone (fracture). Determine how well your osteoporosis treatment is working. Tell a health care provider about: Any allergies you have. All medicines you are taking, including vitamins, herbs, eye drops, creams, and over-the-counter medicines. Any problems you or family members have had with anesthetic medicines. Any blood disorders you have. Any surgeries you have had. Any medical conditions you have. Whether you are pregnant or may be pregnant. Any medical tests you have had within the past 14 days that used contrast material. What are the risks? Generally, this is a safe test. However, it does expose you to a small amount of radiation, which can slightly increase your cancer risk. What happens before the test? Do not take any calcium supplements within the 24 hours before your test. You will need to remove all metal jewelry, eyeglasses, removable dental appliances, and any other metal objects on your body. What happens during the test?  You will lie down on an exam table. There will be an X-ray generator below you and an imaging device above you. Other devices, such as boxes or braces, may be used to position your body properly for the scan. The machine will slowly scan your body. You  will need to keep very still while the machine does the scan. The images will show up on a screen in the room. Images will be examined by a specialist after your test is finished. The procedure may vary among health care providers and hospitals. What can I expect after the test? It is up to you to get the results of your test. Ask your health care provider, or the department that is doing the test, when your results will be ready. Summary A bone density test is an imaging test that uses a type of X-ray to measure the amount of calcium and other minerals in your bones. The test may be used to diagnose or screen you for a condition that causes weak or thin bones (osteoporosis), predict your risk for a broken bone (fracture), or determine how well your osteoporosis treatment is working. Do not take any calcium supplements within 24 hours before your test. Ask your health care provider, or the department that is doing the test, when your results will be ready. This information is not intended to replace advice given to you by your health care provider. Make sure you discuss any questions you have with your health care provider. Document Revised: 01/05/2021 Document Reviewed: 10/09/2019 Elsevier Patient Education  2022 [employer].

## 2021-03-29 NOTE — Assessment & Plan Note (Signed)
Chronic, ongoing with recent exacerbation due to trigger period in life with turning 70 soon.  Denies SI/HI.  PHQ9 = 10 and GAD7 = 3.  Discussed various options with her, at this time it was discussed we will trial Wellbutrin XL 150 MG daily, this may offer benefit to mood and motivation + help with weight loss and smoking cessation.  Educated her on this medication + side effects to alert provider off.  Return in 6 weeks for follow-up.

## 2021-03-29 NOTE — Progress Notes (Signed)
BP 104/72   Pulse 85   Temp 98.2 F (36.8 C) (Oral)   Wt 151 lb 3.2 oz (68.6 kg)   LMP  (LMP Unknown)   SpO2 99%   BMI 29.76 kg/m    Subjective:    Patient ID: Priscilla [name], female    DOB: Mar 04, 1951, 70 y.o.   MRN: [id number]  HPI: Priscilla [name] is a 70 y.o. female  Chief Complaint  Patient presents with   Back Pain    Patient states she the back pain for her is a new issue. Patient states back during the Summer she hurt her foot and had to wear a boot and noticed since wearing the boot she has had issues with her back. Patient states she notices it more when she stands for long periods of time. Patient states when she tries to cook dinner her lower back will hurt.    Fatigue    Patient states since her last visit she had been taking Vitamin B-12 and D. Patient states she is noticing she is more fatigue. Patient states her body is just exhausted. Patient states she is still taking her vitamins and she states she does not have any energy and she states she doesn't feel like the "happy her" that she used to be before.    Weight Gain    Patient states since she was seen in May for a visit. She has noticed a 10 pound weight gain and she states she is feels like crap and she thinks she is depressed from these issues.    FATIGUE Ongoing issue for months.  Low levels of B12 past labs with level 152 and Vitamin D.  Has been taking B12 for 3 months, does not notice much difference in fatigue.  Does endorse her birthday approaching is a trigger a little, turning 70 soon. Duration:  months Severity: 7/10  Onset: gradual Context when symptoms started:  unknown Symptoms improve with rest: yes  Depressive symptoms: yes with weight gain Stress/anxiety: yes with weight gain Insomnia: none Snoring: no Observed apnea by bed partner: no Daytime hypersomnolence:no Wakes feeling refreshed: yes History of sleep study: no Dysnea on exertion:  no Orthopnea/PND: no Chest pain: no Chronic  cough: no Lower extremity edema: no Arthralgias:no Myalgias: no Weakness: no Rash: no -- no tick bites recently  Depression screen [facility] 2/9 03/29/2021 02/03/2021 04/14/2020 04/14/2020 04/01/2019  Decreased Interest 3 0 0 0 0  Down, Depressed, Hopeless 3 0 0 0 0  PHQ - 2 Score 6 0 0 0 0  Altered sleeping 0 - 1 - 0  Tired, decreased energy 3 - 1 - 0  Change in appetite 1 - 0 - 0  Feeling bad or failure about yourself  - - 0 - 0  Trouble concentrating 0 - 0 - 0  Moving slowly or fidgety/restless 0 - 0 - 0  Suicidal thoughts 0 - 0 - 0  PHQ-9 Score 10 - 2 - 0  Difficult doing work/chores Somewhat difficult - Not difficult at all - Not difficult at all    GAD 7 : Generalized Anxiety Score 03/29/2021 04/01/2019 03/16/2015  Nervous, Anxious, on Edge 0 0 3  Control/stop worrying 0 0 0  Worry too much - different things 0 0 0  Trouble relaxing 0 0 0  Restless 0 0 0  Easily annoyed or irritable 3 0 0  Afraid - awful might happen 0 0 3  Total GAD 7 Score 3 0 6  Anxiety Difficulty Somewhat difficult Not difficult at all -     BACK PAIN Initially presented in summer time, June/July, when she had to wear boot after fracturing foot.  Notices it more when standing for long periods and while cooking.  She plans on attending chiropractor.   She does endorse frustration with her weight gain, especially since wearing boot.  Does not feel her happy self sometimes with this.   Duration: months Mechanism of injury:  as above Location: midline and low back Onset: gradual Severity: 7/10 at worst Quality: dull, aching, and throbbing -- nagging pain Frequency: intermittent Radiation: none Aggravating factors:  prolonged standing, movement, and walking Alleviating factors: nothing Status: fluctuating Treatments attempted: APAP, ibuprofen, and aleve  Relief with NSAIDs?: No NSAIDs Taken Nighttime pain:  no Paresthesias / decreased sensation:  no Bowel / bladder incontinence:  no Fevers:  no Dysuria  / urinary frequency:  no   Relevant past medical, surgical, family and social history reviewed and updated as indicated. Interim medical history since our last visit reviewed. Allergies and medications reviewed and updated.  Review of Systems  Constitutional:  Positive for fatigue. Negative for activity change, appetite change, diaphoresis and fever.  Respiratory:  Negative for cough, chest tightness, shortness of breath and wheezing.   Cardiovascular:  Negative for chest pain, palpitations and leg swelling.  Gastrointestinal: Negative.   Endocrine: Negative.   Genitourinary: Negative.   Musculoskeletal:  Positive for back pain.  Neurological: Negative.   Psychiatric/Behavioral:  Negative for decreased concentration, self-injury, sleep disturbance and suicidal ideas. The patient is not nervous/anxious.    Per HPI unless specifically indicated above     Objective:    BP 104/72   Pulse 85   Temp 98.2 F (36.8 C) (Oral)   Wt 151 lb 3.2 oz (68.6 kg)   LMP  (LMP Unknown)   SpO2 99%   BMI 29.76 kg/m   Wt Readings from Last 3 Encounters:  03/29/21 151 lb 3.2 oz (68.6 kg)  09/28/20 141 lb 3.2 oz (64 kg)  04/14/20 139 lb 6.4 oz (63.2 kg)    Physical Exam Vitals and nursing note reviewed.  Constitutional:      General: She is awake. She is not in acute distress.    Appearance: She is well-developed and well-groomed. She is not ill-appearing or toxic-appearing.  HENT:     Head: Normocephalic.     Right Ear: Hearing normal.     Left Ear: Hearing normal.  Eyes:     General: Lids are normal.        Right eye: No discharge.        Left eye: No discharge.     Conjunctiva/sclera: Conjunctivae normal.     Pupils: Pupils are equal, round, and reactive to light.  Neck:     Thyroid: No thyromegaly.     Vascular: No carotid bruit or JVD.  Cardiovascular:     Rate and Rhythm: Normal rate and regular rhythm.     Heart sounds: Normal heart sounds. No murmur heard.   No gallop.   Pulmonary:     Effort: Pulmonary effort is normal. No accessory muscle usage or respiratory distress.     Breath sounds: Normal breath sounds.  Abdominal:     General: Bowel sounds are normal.     Palpations: Abdomen is soft. There is no hepatomegaly or splenomegaly.  Musculoskeletal:     Cervical back: Normal range of motion and neck supple.     Lumbar back: No  swelling, spasms or tenderness. Normal range of motion. Negative right straight leg raise test and negative left straight leg raise test. Scoliosis (mild upper thoracic) present.     Right lower leg: No edema.     Left lower leg: No edema.     Comments: Mild kyphosis noted R>L.  Lymphadenopathy:     Cervical: No cervical adenopathy.  Skin:    General: Skin is warm and dry.  Neurological:     Mental Status: She is alert and oriented to person, place, and time.  Psychiatric:        Attention and Perception: Attention normal.        Mood and Affect: Mood normal.        Speech: Speech normal.        Behavior: Behavior normal. Behavior is cooperative.        Thought Content: Thought content normal.   Results for orders placed or performed in visit on 04/14/20  CBC  Result Value Ref Range   WBC 6.3 3.4 - 10.8 x10E3/uL   RBC 4.43 3.77 - 5.28 x10E6/uL   Hemoglobin 15.4 11.1 - 15.9 g/dL   Hematocrit 44.0 34.0 - 46.6 %   MCV 99 (H) 79 - 97 fL   MCH 34.8 (H) 26.6 - 33.0 pg   MCHC 35.0 31.5 - 35.7 g/dL   RDW 12.7 11.7 - 15.4 %   Platelets 228 150 - 450 x10E3/uL  Comp Met (CMET)  Result Value Ref Range   Glucose 89 65 - 99 mg/dL   BUN 14 8 - 27 mg/dL   Creatinine, Ser 0.80 0.57 - 1.00 mg/dL   GFR calc non Af Amer 76 >59 mL/min/1.73   GFR calc Af Amer 88 >59 mL/min/1.73   BUN/Creatinine Ratio 18 12 - 28   Sodium 142 134 - 144 mmol/L   Potassium 4.2 3.5 - 5.2 mmol/L   Chloride 102 96 - 106 mmol/L   CO2 28 20 - 29 mmol/L   Calcium 10.4 (H) 8.7 - 10.3 mg/dL   Total Protein 7.1 6.0 - 8.5 g/dL   Albumin 4.3 3.8 - 4.8 g/dL    Globulin, Total 2.8 1.5 - 4.5 g/dL   Albumin/Globulin Ratio 1.5 1.2 - 2.2   Bilirubin Total 0.2 0.0 - 1.2 mg/dL   Alkaline Phosphatase 80 44 - 121 IU/L   AST 13 0 - 40 IU/L   ALT 9 0 - 32 IU/L  Lipid Profile  Result Value Ref Range   Cholesterol, Total 231 (H) 100 - 199 mg/dL   Triglycerides 186 (H) 0 - 149 mg/dL   HDL 50 >39 mg/dL   VLDL Cholesterol Cal 34 5 - 40 mg/dL   LDL Chol Calc (NIH) 147 (H) 0 - 99 mg/dL   Chol/HDL Ratio 4.6 (H) 0.0 - 4.4 ratio  Thyroid Panel With TSH  Result Value Ref Range   TSH 2.490 0.450 - 4.500 uIU/mL   T4, Total 9.0 4.5 - 12.0 ug/dL   T3 Uptake Ratio 23 (L) 24 - 39 %   Free Thyroxine Index 2.1 1.2 - 4.9  Vitamin B12  Result Value Ref Range   Vitamin B-12 152 (L) 232 - 1,245 pg/mL      Assessment & Plan:   Problem List Items Addressed This Visit       Other   B12 deficiency    Noted on past labs, is taking oral supplement.  Recheck B12 level today and if remains on lower side consider monthly injections.  Relevant Orders   Vitamin B12   Depression - Primary    Chronic, ongoing with recent exacerbation due to trigger period in life with turning 21 soon.  Denies SI/HI.  PHQ9 = 10 and GAD7 = 3.  Discussed various options with her, at this time it was discussed we will trial Wellbutrin XL 150 MG daily, this may offer benefit to mood and motivation + help with weight loss and smoking cessation.  Educated her on this medication + side effects to alert provider off.  Return in 6 weeks for follow-up.      Relevant Medications   buPROPion (WELLBUTRIN XL) 150 MG 24 hr tablet   Fatigue    Ongoing for over a month, suspect much related to current exacerbation of depression. Refer to depression plan for further.  Labs today CBC, CMP, TSH, B12, Vit D, ANA, CRP, ESR.        Relevant Orders   T4, free   TSH   Comprehensive metabolic panel   CBC with Differential/Platelet   C-reactive protein   Sed Rate (ESR)   ANA w/Reflex if Positive   Low  back pain    Ongoing since having to wear boot in July.  Suspect more muscular, no red flags.  At this time recommend she visit with chiropractor, she is noted to have mild scoliosis on exam and kyphosis R>L.  Will trial Tizanidine to take at night as needed + Lidocaine patches to wear during the day for comfort.  Recommend use of heat at home and rest.  May take Ibuprofen or Tylenol as needed.  Return in 6 weeks.      Relevant Medications   tizanidine (ZANAFLEX) 2 MG capsule   Nicotine dependence, cigarettes, uncomplicated    I have recommended complete cessation of tobacco use. I have discussed various options available for assistance with tobacco cessation including over the counter methods (Nicotine gum, patch and lozenges). We also discussed prescription options (Chantix, Nicotine Inhaler / Nasal Spray). The patient is not interested in pursuing any prescription tobacco cessation options at this time.       Vitamin D deficiency    Chronic, ongoing, recheck Vit D level today and continue current supplement.  Recommend she obtain DEXA scan.      Relevant Orders   VITAMIN D 25 Hydroxy (Vit-D Deficiency, Fractures)     Follow up plan: Return in about 6 weeks (around 05/10/2021) for Depression + Back pain.

## 2021-03-29 NOTE — Assessment & Plan Note (Signed)
Chronic, ongoing, recheck Vit D level today and continue current supplement.  Recommend she obtain DEXA scan.

## 2021-03-29 NOTE — Assessment & Plan Note (Signed)
I have recommended complete cessation of tobacco use. I have discussed various options available for assistance with tobacco cessation including over the counter methods (Nicotine gum, patch and lozenges). We also discussed prescription options (Chantix, Nicotine Inhaler / Nasal Spray). The patient is not interested in pursuing any prescription tobacco cessation options at this time.  

## 2021-03-30 LAB — CBC WITH DIFFERENTIAL/PLATELET
Basophils Absolute: 0.1 10*3/uL (ref 0.0–0.2)
Basos: 1 %
EOS (ABSOLUTE): 0.2 10*3/uL (ref 0.0–0.4)
Eos: 3 %
Hematocrit: 45.5 % (ref 34.0–46.6)
Hemoglobin: 15.4 g/dL (ref 11.1–15.9)
Immature Grans (Abs): 0 10*3/uL (ref 0.0–0.1)
Immature Granulocytes: 0 %
Lymphocytes Absolute: 1.8 10*3/uL (ref 0.7–3.1)
Lymphs: 23 %
MCH: 33 pg (ref 26.6–33.0)
MCHC: 33.8 g/dL (ref 31.5–35.7)
MCV: 98 fL — ABNORMAL HIGH (ref 79–97)
Monocytes Absolute: 0.5 10*3/uL (ref 0.1–0.9)
Monocytes: 6 %
Neutrophils Absolute: 5.3 10*3/uL (ref 1.4–7.0)
Neutrophils: 67 %
Platelets: 217 10*3/uL (ref 150–450)
RBC: 4.66 x10E6/uL (ref 3.77–5.28)
RDW: 12.5 % (ref 11.7–15.4)
WBC: 7.9 10*3/uL (ref 3.4–10.8)

## 2021-03-30 LAB — T4, FREE: Free T4: 1.12 ng/dL (ref 0.82–1.77)

## 2021-03-30 LAB — VITAMIN D 25 HYDROXY (VIT D DEFICIENCY, FRACTURES): Vit D, 25-Hydroxy: 51.6 ng/mL (ref 30.0–100.0)

## 2021-03-30 LAB — COMPREHENSIVE METABOLIC PANEL
ALT: 12 IU/L (ref 0–32)
AST: 8 IU/L (ref 0–40)
Albumin/Globulin Ratio: 1.7 (ref 1.2–2.2)
Albumin: 4.3 g/dL (ref 3.8–4.8)
Alkaline Phosphatase: 90 IU/L (ref 44–121)
BUN/Creatinine Ratio: 15 (ref 12–28)
BUN: 13 mg/dL (ref 8–27)
Bilirubin Total: 0.3 mg/dL (ref 0.0–1.2)
CO2: 27 mmol/L (ref 20–29)
Calcium: 10.5 mg/dL — ABNORMAL HIGH (ref 8.7–10.3)
Chloride: 103 mmol/L (ref 96–106)
Creatinine, Ser: 0.87 mg/dL (ref 0.57–1.00)
Globulin, Total: 2.6 g/dL (ref 1.5–4.5)
Glucose: 79 mg/dL (ref 70–99)
Potassium: 4.7 mmol/L (ref 3.5–5.2)
Sodium: 143 mmol/L (ref 134–144)
Total Protein: 6.9 g/dL (ref 6.0–8.5)
eGFR: 72 mL/min/{1.73_m2} (ref >59)

## 2021-03-30 LAB — C-REACTIVE PROTEIN: CRP: 3 mg/L (ref 0–10)

## 2021-03-30 LAB — ANA W/REFLEX IF POSITIVE: Anti Nuclear Antibody (ANA): NEGATIVE

## 2021-03-30 LAB — VITAMIN B12: Vitamin B-12: 1014 pg/mL (ref 232–1245)

## 2021-03-30 LAB — TSH: TSH: 3.04 u[IU]/mL (ref 0.450–4.500)

## 2021-03-30 LAB — SEDIMENTATION RATE: Sed Rate: 32 mm/hr (ref 0–40)

## 2021-03-30 NOTE — Progress Notes (Signed)
Contacted via [place] evening Christinia, your labs have returned and are overall normal and stable with exception of mild elevation in calcium, which could be related to calcium intake or multivitamin use.  Will recheck next visit.  B12 level has improved, continued supplement daily.  No concern for autoimmune disease on ANA and CRP,ESR.  Any questions? Keep being amazing!!  Thank you for allowing me to participate in your care.  I appreciate you. Kindest regards, Juda Toepfer

## 2021-04-03 ENCOUNTER — Encounter: Payer: Self-pay | Admitting: Nurse Practitioner

## 2021-04-11 ENCOUNTER — Emergency Department
Admission: EM | Admit: 2021-04-11 | Discharge: 2021-04-11 | Disposition: A | Discharge status: Home / Self Care | Payer: Medicare Other | Attending: Emergency Medicine | Admitting: Emergency Medicine

## 2021-04-11 ENCOUNTER — Other Ambulatory Visit: Payer: Self-pay

## 2021-04-11 DIAGNOSIS — W1839XA Other fall on same level, initial encounter: Secondary | ICD-10-CM | POA: Diagnosis not present

## 2021-04-11 DIAGNOSIS — U071 COVID-19: Secondary | ICD-10-CM | POA: Insufficient documentation

## 2021-04-11 DIAGNOSIS — F17210 Nicotine dependence, cigarettes, uncomplicated: Secondary | ICD-10-CM | POA: Diagnosis not present

## 2021-04-11 DIAGNOSIS — R531 Weakness: Secondary | ICD-10-CM | POA: Diagnosis present

## 2021-04-11 DIAGNOSIS — Z28310 Unvaccinated for covid-19: Secondary | ICD-10-CM | POA: Insufficient documentation

## 2021-04-11 LAB — CBC
HCT: 43.2 % (ref 39.0–52.0)
Hemoglobin: 14.3 g/dL (ref 13.0–17.0)
MCH: 32.6 pg (ref 26.0–34.0)
MCHC: 33.1 g/dL (ref 30.0–36.0)
MCV: 98.6 fL (ref 80.0–100.0)
Platelets: 209 10*3/uL (ref 150–400)
RBC: 4.38 MIL/uL (ref 4.22–5.81)
RDW: 12.8 % (ref 11.5–15.5)
WBC: 6.4 10*3/uL (ref 4.0–10.5)
nRBC: 0 % (ref 0.0–0.2)

## 2021-04-11 LAB — COMPREHENSIVE METABOLIC PANEL
ALT: 14 U/L (ref 0–44)
AST: 17 U/L (ref 15–41)
Albumin: 3.8 g/dL (ref 3.5–5.0)
Alkaline Phosphatase: 75 U/L (ref 38–126)
Anion gap: 6 (ref 5–15)
BUN: 12 mg/dL (ref 8–23)
CO2: 27 mmol/L (ref 22–32)
Calcium: 9.6 mg/dL (ref 8.9–10.3)
Chloride: 102 mmol/L (ref 98–111)
Creatinine, Ser: 0.95 mg/dL (ref 0.61–1.24)
GFR, Estimated: >60 mL/min (ref >60)
Glucose, Bld: 116 mg/dL — ABNORMAL HIGH (ref 70–99)
Potassium: 4.3 mmol/L (ref 3.5–5.1)
Sodium: 135 mmol/L (ref 135–145)
Total Bilirubin: 0.6 mg/dL (ref 0.3–1.2)
Total Protein: 7.4 g/dL (ref 6.5–8.1)

## 2021-04-11 LAB — RESP PANEL BY RT-PCR (FLU A&B, COVID) ARPGX2
Influenza A by PCR: NEGATIVE
Influenza B by PCR: NEGATIVE
SARS Coronavirus 2 by RT PCR: POSITIVE — AB

## 2021-04-11 MED ORDER — NIRMATRELVIR/RITONAVIR (PAXLOVID)TABLET: 3.0000 | ORAL_TABLET | Freq: Two times a day (BID) | ORAL | 0 refills | Status: AC

## 2021-04-11 MED ORDER — ACETAMINOPHEN 325 MG PO TABS
650.0000 mg | ORAL_TABLET | Freq: Once | ORAL | Status: AC
  Administered 2021-04-11: 650 mg via ORAL

## 2021-04-11 MED ORDER — ACETAMINOPHEN 325 MG PO TABS
ORAL_TABLET | ORAL | Status: AC
  Filled 2021-04-11: qty 2

## 2021-04-11 NOTE — ED Triage Notes (Signed)
Pt states she had an episode of generalized weakness this am states due to the same she fell. Denies any head injury, but states still has general malaise. Pt denies any fever or other cold symptoms.

## 2021-04-11 NOTE — ED Provider Notes (Signed)
[facility] Emergency Department Provider Note   ____________________________________________    I have reviewed the triage vital signs and the nursing notes.   HISTORY  Chief Complaint Fall     HPI Priscilla [name] is a 70 y.o. adult who presents with complaints of generalized weakness, patient reports she had a fall this morning but denies any injury.  She states that she feels weak and fatigued.  Symptoms started this morning, she felt well yesterday.  No sick contacts reported.  Has had some chills but does not think she has had a fever.   Past Medical History:  Diagnosis Date   Anxiety    Depression    Menopause     Patient Active Problem List   Diagnosis Date Noted   Fatigue 03/29/2021   Vitamin D deficiency 03/29/2021   Low back pain 03/29/2021   B12 deficiency 04/15/2020   Elevated LDL cholesterol level 04/02/2019   Cyst, breast solitary, left 03/05/2019   Cyst of right breast 03/05/2019   Hearing loss 02/12/2017   Advance care planning 02/12/2017   Nicotine dependence, cigarettes, uncomplicated 02/09/2016   Menopause 12/10/2014   Depression 12/10/2014   IBS (irritable bowel syndrome) 12/10/2014    Past Surgical History:  Procedure Laterality Date   CHOLECYSTECTOMY     ovarian cyst rupture      Prior to Admission medications   Medication Sig Start Date End Date Taking? Authorizing Provider  nirmatrelvir/ritonavir EUA (PAXLOVID) 20 x 150 MG & 10 x 100MG  TABS Take 3 tablets by mouth 2 (two) times daily for 5 days. Patient GFR is 60. Take nirmatrelvir (150 mg) two tablets twice daily for 5 days and ritonavir (100 mg) one tablet twice daily for 5 days. 04/11/21 04/16/21 Yes 14/10/22, MD  amitriptyline (ELAVIL) 25 MG tablet Take 2 tablets (50 mg total) by mouth at bedtime. 04/14/20   Cannady, 14/8/21 T, NP  buPROPion (WELLBUTRIN XL) 150 MG 24 hr tablet Take 1 tablet (150 mg total) by mouth daily. 03/29/21   03/31/21 T, NP   cholecalciferol (VITAMIN D) 1000 UNITS tablet Take by mouth. Take 2 capsules once daily    [provider]  hyoscyamine (LEVBID) 0.375 MG 12 hr tablet Take 1 tablet (0.375 mg total) by mouth every 12 (twelve) hours as needed. 04/14/20   Cannady, 14/8/21 T, NP  lidocaine (LIDODERM) 5 % Place 1 patch onto the skin daily. Remove & Discard patch within 12 hours or as directed by MD 03/29/21   03/31/21, NP  lubiprostone (AMITIZA) 8 MCG capsule Take 1 capsule (8 mcg total) by mouth 2 (two) times daily with a meal. Patient not taking: Reported on 02/03/2021 04/14/20   14/8/21 T, NP  NONFORMULARY OR COMPOUNDED ITEM Take 125 mg by mouth daily. Progesterone from [facility] 04/14/20   Cannady, 14/8/21 T, NP  tizanidine (ZANAFLEX) 2 MG capsule Take 1 capsule (2 mg total) by mouth at bedtime as needed for muscle spasms. 03/29/21   03/31/21, NP     Allergies Patient has no known allergies.  Family History  Problem Relation Age of Onset   Dementia Mother    Thyroid disease Mother    Hypertension Mother    Arthritis Mother    Diabetes Mother    Diabetes Father     Social History Social History   Tobacco Use   Smoking status: Every Day    Packs/day: 0.50    Types: Cigarettes  Smokeless tobacco: Never  Vaping Use   Vaping Use: Never used  Substance Use Topics   Alcohol use: No    Alcohol/week: 0.0 standard drinks   Drug use: No    Review of Systems  Constitutional: No fever/chills Eyes: No visual changes.  ENT: No sore throat. Cardiovascular: Denies chest pain. Respiratory: Denies shortness of breath. Gastrointestinal: No abdominal pain.  No nausea, no vomiting.   Genitourinary: Negative for dysuria. Musculoskeletal: Negative for back pain. Skin: Negative for rash. Neurological: Negative for headaches or weakness   ____________________________________________   PHYSICAL EXAM:  VITAL SIGNS: ED Triage Vitals  Enc Vitals Group     BP 04/11/21  0856 (!) 106/45     Pulse Rate 04/11/21 0856 (!) 107     Resp 04/11/21 0856 20     Temp 04/11/21 0856 (!) 100.4 F (38 C)     Temp Source 04/11/21 0856 Oral     SpO2 04/11/21 0856 95 %     Weight 04/11/21 0857 59 kg (130 lb)     Height 04/11/21 0857 1.524 m (5')     Head Circumference --      Peak Flow --      Pain Score 04/11/21 0857 0     Pain Loc --      Pain Edu? --      Excl. in GC? --     Constitutional: Alert and oriented.  Eyes: Conjunctivae are normal.   Nose: No congestion/rhinnorhea. Mouth/Throat: Mucous membranes are moist.    Cardiovascular: Normal rate, regular rhythm. Grossly normal heart sounds.  Good peripheral circulation. Respiratory: Normal respiratory effort.  No retractions. Lungs CTAB. Gastrointestinal: Soft and nontender. No distention.   Musculoskeletal: No lower extremity tenderness nor edema.   Neurologic:  Normal speech and language. No gross focal neurologic deficits are appreciated.  Skin:  Skin is warm, dry and intact. No rash noted. Psychiatric: Mood and affect are normal. Speech and behavior are normal.  ____________________________________________   LABS (all labs ordered are listed, but only abnormal results are displayed)  Labs Reviewed  RESP PANEL BY RT-PCR (FLU A&B, COVID) ARPGX2 - Abnormal; Notable for the following components:      Result Value   SARS Coronavirus 2 by RT PCR POSITIVE (*)    All other components within normal limits  COMPREHENSIVE METABOLIC PANEL - Abnormal; Notable for the following components:   Glucose, Bld 116 (*)    All other components within normal limits  CBC   ____________________________________________  EKG   ____________________________________________  RADIOLOGY   ____________________________________________   PROCEDURES  Procedure(s) performed: No  Procedures   Critical Care performed: No ____________________________________________   INITIAL IMPRESSION / ASSESSMENT AND PLAN /  ED COURSE  Pertinent labs & imaging results that were available during my care of the patient were reviewed by me and considered in my medical decision making (see chart for details).   Patient presents with generalized weakness, fall without injury this morning.  Overall well-appearing on exam, low-grade fever noted suspicious for COVID versus flu  Lab work is overall unremarkable, normal CBC, normal CMP  COVID test has returned positive, this is likely the cause of her symptoms, given that she is within 3 days and has not received any vaccines, will prescribe paxlovid    ____________________________________________   FINAL CLINICAL IMPRESSION(S) / ED DIAGNOSES  Final diagnoses:  COVID-19        Note:  This document was prepared using Dragon voice recognition software and may include  unintentional dictation errors.    Jene Every, MD 04/11/21 [phone]

## 2021-04-12 ENCOUNTER — Ambulatory Visit: Payer: Self-pay

## 2021-04-12 NOTE — Telephone Encounter (Signed)
Pt called, states she went to ED yesterday morning and was positive for COVID but also had a fall yesterday mornnig and her back is killing her. She says she is unable to move around or walk right now d/t the pain. The pain is in her lower back and she feels like it needs to be xrayed, She has spoke with Corrie Dandy, NP previously about the back pain. She was seen in office on 03/29/21 and suppose to return in 6 weeks but with fall and everything new going on scheduled appt for tomorrow at 1340. Appt was available to today at 1540 but pt preferred appt for tomorrow. Pt advised to rest and take it easy as much as possible to help with back pain and continue taking medication to relieve back pain. Care advice given and pt verbalized understanding. No other questions/concerns noted.     Summary: concerns from ED yesterday   Shanda Bumps [phone] is not on DPR but still wanted someone to try to follow up directly with the pt after being in the ED.   ----- Message from [facility] [place] sent at 04/12/2021  1:04 PM EST -----  Pts co-worker Shanda Bumps [phone]) called in very concerned about the pt, pt was just in the ED yesterday and has sent some texts that did not make sense. Pts co-worker wanted a nurse to try to contact the pt to check up on her, please advise. Please try to call pts home number first then her mobile number as well.      Reason for Disposition  Unable to walk  Answer Assessment - Initial Assessment Questions 1. ONSET: "When did the pain begin?"      Been going on for months 2. LOCATION: "Where does it hurt?" (upper, mid or lower back)     Lower back 3. SEVERITY: "How bad is the pain?"  (e.g., Scale 1-10; mild, moderate, or severe)   - MILD (1-3): doesn't interfere with normal activities    - MODERATE (4-7): interferes with normal activities or awakens from sleep    - SEVERE (8-10): excruciating pain, unable to do any normal activities      Moderate, unable to move around  4.  PATTERN: "Is the pain constant?" (e.g., yes, no; constant, intermittent)      constant 5. RADIATION: "Does the pain shoot into your legs or elsewhere?"     NA 6. CAUSE:  "What do you think is causing the back pain?"      unsure 7. BACK OVERUSE:  "Any recent lifting of heavy objects, strenuous work or exercise?"     No 8. MEDICATIONS: "What have you taken so far for the pain?" (e.g., nothing, acetaminophen, NSAIDS)     Yes 9. NEUROLOGIC SYMPTOMS: "Do you have any weakness, numbness, or problems with bowel/bladder control?"     No 10. OTHER SYMPTOMS: "Do you have any other symptoms?" (e.g., fever, abdominal pain, burning with urination, blood in urine)       No 11. PREGNANCY: "Is there any chance you are pregnant?" (e.g., yes, no; LMP)       No  Protocols used: Back Pain-A-AH

## 2021-04-13 ENCOUNTER — Encounter: Payer: Self-pay | Admitting: Nurse Practitioner

## 2021-04-13 ENCOUNTER — Ambulatory Visit
Admission: RE | Admit: 2021-04-13 | Discharge: 2021-04-13 | Disposition: A | Discharge status: Home / Self Care | Payer: Medicare Other | Attending: Nurse Practitioner | Admitting: Nurse Practitioner

## 2021-04-13 ENCOUNTER — Other Ambulatory Visit: Payer: Self-pay

## 2021-04-13 ENCOUNTER — Ambulatory Visit (INDEPENDENT_AMBULATORY_CARE_PROVIDER_SITE_OTHER): Payer: Medicare Other | Admitting: Nurse Practitioner

## 2021-04-13 ENCOUNTER — Ambulatory Visit
Admission: RE | Admit: 2021-04-13 | Discharge: 2021-04-13 | Disposition: A | Discharge status: Home / Self Care | Payer: Medicare Other | Source: Ambulatory Visit | Attending: Nurse Practitioner | Admitting: Nurse Practitioner

## 2021-04-13 VITALS — BP 93/61 | HR 90 | Temp 102.3°F

## 2021-04-13 DIAGNOSIS — G8929 Other chronic pain: Secondary | ICD-10-CM | POA: Diagnosis not present

## 2021-04-13 DIAGNOSIS — U071 COVID-19: Secondary | ICD-10-CM | POA: Insufficient documentation

## 2021-04-13 DIAGNOSIS — M545 Low back pain, unspecified: Secondary | ICD-10-CM

## 2021-04-13 DIAGNOSIS — M549 Dorsalgia, unspecified: Secondary | ICD-10-CM | POA: Diagnosis not present

## 2021-04-13 MED ORDER — HYDROCODONE-ACETAMINOPHEN 5-325 MG PO TABS
1.0000 | ORAL_TABLET | Freq: Four times a day (QID) | ORAL | 0 refills | Status: AC | PRN
Start: 2021-04-13 — End: 2021-04-18

## 2021-04-13 NOTE — Progress Notes (Signed)
BP 93/61   Pulse 90   Temp (!) 102.3 F (39.1 C)   LMP  (LMP Unknown)   SpO2 93%    Subjective:    Patient ID: Priscilla [name], adult    DOB: 10/26/1950, 70 y.o.   MRN: [id number]  HPI: Priscilla [name] is a 70 y.o. adult  Chief Complaint  Patient presents with   Fall    Patient went to the ER for a fall off of a stool Monday. Patient states she is having back pain and is unable to walk due to the pain. Patient states they did not perform a x-ray of her back and they prescribed her medication for her COVID. Patient husband states she was having back pain before the fall.    Back Pain    Patient states that when she walks it feels as if she does not have any muscle in her legs and states it feels weak. Patient states she would really just like for her back looked at. Patient states she was prescribed muscle relaxers but they didn't help at all.   BACK PAIN Had a recent fall face first onto floor off stool, jammed her finger a little on right hand.  This was on Monday.  Pain to back did not feel any worse after fall.  Went to ER, was diagnosed with Covid and on Paxlovid at this time.  Follow-up for back pain.  Initially presented in summer time, June/July, when she had to wear boot after fracturing foot.  Notices it more when standing for long periods and while cooking.  Can not stand for long periods at this time, when sitting has no discomfort. Has not been to chiropractor.  Pain has become worse since last visit on 03/29/21.   Was given Tizanidine and Lidocaine patches, but is not helping 100%.   Duration: months Mechanism of injury:  as above Location: midline and low back Onset: gradual Severity: 8/10 at worst Quality: dull, aching, and throbbing -- nagging pain Frequency: intermittent Radiation: none -- stays only to lower back Aggravating factors:  prolonged standing, movement, and walking Alleviating factors: nothing Status: fluctuating Treatments attempted: APAP,  Ibuprofen Relief with NSAIDs?: No NSAIDs Taken Nighttime pain:  no Paresthesias / decreased sensation:  no Bowel / bladder incontinence:  no Fevers:  no Dysuria / urinary frequency:  no   Relevant past medical, surgical, family and social history reviewed and updated as indicated. Interim medical history since our last visit reviewed. Allergies and medications reviewed and updated.  Review of Systems  Constitutional:  Positive for fatigue. Negative for activity change, appetite change, diaphoresis and fever.  Respiratory:  Negative for cough, chest tightness, shortness of breath and wheezing.   Cardiovascular:  Negative for chest pain, palpitations and leg swelling.  Gastrointestinal: Negative.   Endocrine: Negative.   Genitourinary: Negative.   Musculoskeletal:  Positive for back pain.  Neurological: Negative.   Psychiatric/Behavioral:  Negative for decreased concentration, self-injury, sleep disturbance and suicidal ideas. The patient is not nervous/anxious.    Per HPI unless specifically indicated above     Objective:    BP 93/61   Pulse 90   Temp (!) 102.3 F (39.1 C)   LMP  (LMP Unknown)   SpO2 93%   Wt Readings from Last 3 Encounters:  04/11/21 130 lb (59 kg)  03/29/21 151 lb 3.2 oz (68.6 kg)  09/28/20 141 lb 3.2 oz (64 kg)    Physical Exam Vitals and nursing note reviewed.  Constitutional:      General: She is awake. She is not in acute distress.    Appearance: She is well-developed and well-groomed. She is not ill-appearing or toxic-appearing.  HENT:     Head: Normocephalic.     Right Ear: Hearing normal.     Left Ear: Hearing normal.  Eyes:     General: Lids are normal.        Right eye: No discharge.        Left eye: No discharge.     Conjunctiva/sclera: Conjunctivae normal.     Pupils: Pupils are equal, round, and reactive to light.  Neck:     Thyroid: No thyromegaly.     Vascular: No carotid bruit or JVD.  Cardiovascular:     Rate and Rhythm:  Normal rate and regular rhythm.     Heart sounds: Normal heart sounds. No murmur heard.   No gallop.  Pulmonary:     Effort: Pulmonary effort is normal. No accessory muscle usage or respiratory distress.     Breath sounds: Normal breath sounds.  Abdominal:     General: Bowel sounds are normal.     Palpations: Abdomen is soft. There is no hepatomegaly or splenomegaly.  Musculoskeletal:     Cervical back: Normal range of motion and neck supple.     Lumbar back: Tenderness present. No swelling or spasms. Normal range of motion. Negative right straight leg raise test and negative left straight leg raise test. Scoliosis (mild upper thoracic) present.     Right lower leg: No edema.     Left lower leg: No edema.     Comments: Mild kyphosis noted R>L.  Lymphadenopathy:     Cervical: No cervical adenopathy.  Skin:    General: Skin is warm and dry.  Neurological:     Mental Status: She is alert and oriented to person, place, and time.  Psychiatric:        Attention and Perception: Attention normal.        Mood and Affect: Mood normal.        Speech: Speech normal.        Behavior: Behavior normal. Behavior is cooperative.        Thought Content: Thought content normal.    Results for orders placed or performed during the hospital encounter of 04/11/21  Resp Panel by RT-PCR (Flu A&B, Covid) Nasopharyngeal Swab   Specimen: Nasopharyngeal Swab; Nasopharyngeal(NP) swabs in vial transport medium  Result Value Ref Range   SARS Coronavirus 2 by RT PCR POSITIVE (A) NEGATIVE   Influenza A by PCR NEGATIVE NEGATIVE   Influenza B by PCR NEGATIVE NEGATIVE  CBC  Result Value Ref Range   WBC 6.4 4.0 - 10.5 K/uL   RBC 4.38 4.22 - 5.81 MIL/uL   Hemoglobin 14.3 13.0 - 17.0 g/dL   HCT 01.0 27.2 - 53.6 %   MCV 98.6 80.0 - 100.0 fL   MCH 32.6 26.0 - 34.0 pg   MCHC 33.1 30.0 - 36.0 g/dL   RDW 64.4 03.4 - 74.2 %   Platelets 209 150 - 400 K/uL   nRBC 0.0 0.0 - 0.2 %  Comprehensive metabolic panel   Result Value Ref Range   Sodium 135 135 - 145 mmol/L   Potassium 4.3 3.5 - 5.1 mmol/L   Chloride 102 98 - 111 mmol/L   CO2 27 22 - 32 mmol/L   Glucose, Bld 116 (H) 70 - 99 mg/dL   BUN 12 8 - 23 mg/dL  Creatinine, Ser 0.95 0.61 - 1.24 mg/dL   Calcium 9.6 8.9 - 12.4 mg/dL   Total Protein 7.4 6.5 - 8.1 g/dL   Albumin 3.8 3.5 - 5.0 g/dL   AST 17 15 - 41 U/L   ALT 14 0 - 44 U/L   Alkaline Phosphatase 75 38 - 126 U/L   Total Bilirubin 0.6 0.3 - 1.2 mg/dL   GFR, Estimated >58 >09 mL/min   Anion gap 6 5 - 15      Assessment & Plan:   Problem List Items Addressed This Visit       Other   Lab test positive for detection of COVID-19 virus    Currently on Paxlovid, continue this and ensure plenty of rest and fluids.  Return in one week.      Low back pain - Primary    Ongoing since having to wear boot in July 2022.  No red flags.  Noted to have mild scoliosis on exam and kyphosis R>L.  Will continue Tizanidine to take at night as needed + Lidocaine patches to wear during the day for comfort + add on Norco short supply to take when up and about.  Obtain imaging of lower and thoracic back.  Recommend use of heat at home and rest.  May take Ibuprofen or Tylenol as needed.  Return in 1 week.      Relevant Medications   HYDROcodone-acetaminophen (NORCO) 5-325 MG tablet   Other Relevant Orders   DG Lumbar Spine Complete   DG Thoracic Spine W/Swimmers     Follow up plan: Return in about 1 week (around 04/20/2021) for Back Pain.

## 2021-04-13 NOTE — Assessment & Plan Note (Signed)
Ongoing since having to wear boot in July 2022.  No red flags.  Noted to have mild scoliosis on exam and kyphosis R>L.  Will continue Tizanidine to take at night as needed + Lidocaine patches to wear during the day for comfort + add on Norco short supply to take when up and about.  Obtain imaging of lower and thoracic back.  Recommend use of heat at home and rest.  May take Ibuprofen or Tylenol as needed.  Return in 1 week.

## 2021-04-13 NOTE — Assessment & Plan Note (Signed)
Currently on Paxlovid, continue this and ensure plenty of rest and fluids.  Return in one week.

## 2021-04-13 NOTE — Patient Instructions (Signed)

## 2021-04-14 ENCOUNTER — Encounter: Payer: Self-pay | Admitting: Nurse Practitioner

## 2021-04-14 DIAGNOSIS — I700 Atherosclerosis of aorta: Secondary | ICD-10-CM | POA: Insufficient documentation

## 2021-04-14 NOTE — Progress Notes (Signed)
Contacted via MyChart  Good morning Priscilla [name], your imaging has returned: You do show scoliosis as we discussed, which could be causing some of this discomfort you are having + some arthritic changes are present.  No acute fractures found.  I do highly recommend going for bone density scan as was ordered in September, along with mammogram as soon as possible -- the bone density will help [place] see if you have any osteoporosis causing some of these spinal changes.  You were also noted to have moderate stool in colon.  I recommend taking Metamucil daily to help with passing bowels, as constipation can also lead to discomfort.  For back I would recommend seeing physical therapy, are you interested in pursuing this?  Please let me know. Keep being amazing!!  Thank you for allowing me to participate in your care.  I appreciate you. Kindest regards, Georgianna Band

## 2021-05-04 DIAGNOSIS — H2513 Age-related nuclear cataract, bilateral: Secondary | ICD-10-CM | POA: Diagnosis not present

## 2021-05-04 DIAGNOSIS — H1045 Other chronic allergic conjunctivitis: Secondary | ICD-10-CM | POA: Diagnosis not present

## 2021-05-10 ENCOUNTER — Other Ambulatory Visit: Payer: Self-pay | Admitting: Nurse Practitioner

## 2021-05-10 MED ORDER — NONFORMULARY OR COMPOUNDED ITEM: 125.0000 mg | Freq: Every day | 4 refills | Status: DC

## 2021-05-10 NOTE — Telephone Encounter (Signed)
Requested medication (s) are due for refill today: Yes  Requested medication (s) are on the active medication list: Yes  Last refill:  1 year ago  Future visit scheduled: No  Notes to clinic:  Unable to refill per protocol, medication not assigned to the refill protocol.      Requested Prescriptions  Pending Prescriptions Disp Refills   NONFORMULARY OR COMPOUNDED ITEM 90 each 4    Sig: Take 125 mg by mouth daily. Progesterone from [facility]     There is no refill protocol information for this order

## 2021-05-12 ENCOUNTER — Ambulatory Visit: Payer: Medicare Other | Admitting: Nurse Practitioner

## 2021-05-30 ENCOUNTER — Other Ambulatory Visit: Payer: Self-pay | Admitting: Nurse Practitioner

## 2021-05-30 NOTE — Telephone Encounter (Signed)
Requested Prescriptions  Pending Prescriptions Disp Refills   amitriptyline (ELAVIL) 25 MG tablet [Pharmacy Med Name: AMITRIPTYLINE HCL 25 MG TAB] 180 tablet 0    Sig: Take 2 tablets (50 mg total) by mouth at bedtime.     Psychiatry:  Antidepressants - Heterocyclics (TCAs) Passed - 05/30/2021 12:56 PM      Passed - Completed PHQ-2 or PHQ-9 in the last 360 days      Passed - Valid encounter within last 6 months    Recent Outpatient Visits          1 month ago Chronic midline low back pain without sciatica   [facility] [place], Jolene T, NP   2 months ago Recurrent major depressive disorder, in partial remission (HCC)   [facility] Cannady, [place] T, NP   8 months ago Left foot pain   [facility] [place], [place] T, NP   1 year ago Elevated LDL cholesterol level   [facility] [place], [place] T, NP   2 years ago Annual physical exam   [facility] [place], Dorie Rank, NP

## 2021-07-26 ENCOUNTER — Other Ambulatory Visit: Payer: Self-pay | Admitting: Nurse Practitioner

## 2021-07-27 NOTE — Telephone Encounter (Signed)
Requested Prescriptions  ?Pending Prescriptions Disp Refills  ?? amitriptyline (ELAVIL) 25 MG tablet [Pharmacy Med Name: AMITRIPTYLINE HCL 25 MG TAB] 180 tablet 0  ?  Sig: Take 2 tablets (50 mg total) by mouth at bedtime.  ?  ? Psychiatry:  Antidepressants - Heterocyclics (TCAs) Passed - 07/26/2021 10:31 AM  ?  ?  Passed - Completed PHQ-2 or PHQ-9 in the last 360 days  ?  ?  Passed - Valid encounter within last 6 months  ?  Recent Outpatient Visits   ?      ? 3 months ago Chronic midline low back pain without sciatica  ? [facility] [place], [place] T, NP  ? 4 months ago Recurrent major depressive disorder, in partial remission (HCC)  ? [facility] [place], [place] T, NP  ? 10 months ago Left foot pain  ? [facility] [place], [place] T, NP  ? 1 year ago Elevated LDL cholesterol level  ? [facility] [place], Corrie Dandy T, NP  ? 2 years ago Annual physical exam  ? [facility] [place], Dorie Rank, NP  ?  ?  ? ?  ?  ?  ? ?

## 2021-09-19 ENCOUNTER — Telehealth: Payer: Self-pay | Admitting: Nurse Practitioner

## 2021-09-19 NOTE — Telephone Encounter (Signed)
Spoke with patient to get more information but patient kept saying it was something personal. Patient would like to speak with Jolene personally. Please advise.  ?

## 2021-09-19 NOTE — Telephone Encounter (Signed)
Copied from [place] [id number]. Topic: General - Call Back - No Documentation ?>> Sep 19, 2021  3:34 PM Erick Blinks wrote: ?Reason for CRM: Pt is requesting to speak to [place], about something personal. Please advise  ?Best contact: [phone] (work number until [employer]) ?

## 2021-09-19 NOTE — Telephone Encounter (Signed)
Attempted to call patient back to inform patient needs to either make office visit or send Mychart message. I was on hold for 3 minutes.  ? ?OK for PEC/Nurse Triage to give information if patient calls back. ?

## 2021-09-20 NOTE — Telephone Encounter (Signed)
LVM asking patient to call back. If she calls back please inform her of Priscilla [name]'s recommendations to reach her.  ?

## 2021-09-20 NOTE — Telephone Encounter (Signed)
Pt scheduled 5/17

## 2021-09-21 ENCOUNTER — Ambulatory Visit
Admission: RE | Admit: 2021-09-21 | Discharge: 2021-09-21 | Disposition: A | Discharge status: Home / Self Care | Payer: Medicare Other | Attending: Nurse Practitioner | Admitting: Nurse Practitioner

## 2021-09-21 ENCOUNTER — Encounter: Payer: Self-pay | Admitting: Nurse Practitioner

## 2021-09-21 ENCOUNTER — Ambulatory Visit (INDEPENDENT_AMBULATORY_CARE_PROVIDER_SITE_OTHER): Payer: Medicare Other | Admitting: Nurse Practitioner

## 2021-09-21 ENCOUNTER — Ambulatory Visit
Admission: RE | Admit: 2021-09-21 | Discharge: 2021-09-21 | Disposition: A | Discharge status: Home / Self Care | Payer: Medicare Other | Source: Ambulatory Visit | Attending: Nurse Practitioner | Admitting: Nurse Practitioner

## 2021-09-21 VITALS — BP 106/71 | HR 83 | Temp 98.5°F | Ht 60.0 in | Wt 148.6 lb

## 2021-09-21 DIAGNOSIS — K5900 Constipation, unspecified: Secondary | ICD-10-CM | POA: Diagnosis not present

## 2021-09-21 DIAGNOSIS — K581 Irritable bowel syndrome with constipation: Secondary | ICD-10-CM

## 2021-09-21 MED ORDER — TRULANCE 3 MG PO TABS: 3.0000 mg | ORAL_TABLET | Freq: Every day | ORAL | 4 refills | Status: DC

## 2021-09-21 NOTE — Progress Notes (Signed)
? ?BP 106/71   Pulse 83   Temp 98.5 ?F (36.9 ?C) (Oral)   Ht 5' (1.524 m)   Wt 148 lb 9.6 oz (67.4 kg)   LMP  (LMP Unknown)   SpO2 99%   BMI 29.02 kg/m?   ? ?Subjective:  ? ? Patient ID: Priscilla [name], adult    DOB: 1950/12/10, 71 y.o.   MRN: [id number] ? ?HPI: ?Priscilla [name] is a 71 y.o. adult ? ?Chief Complaint  ?Patient presents with  ? Constipation  ?  Patient states she has not had a bowel movement in the past 3 weeks. Patient states she has tried over the counter medication and she also takes Miralax daily and it is not helping. Patient would like to discuss next options with provider at today's visit.   ? ?CONSTIPATION ?Takes Miralax daily which is offering no benefit.  Not having any cramping or pain. Is leaking stool, every day.  Last adequate bowel movement 3 weeks ago, been a little while.  Has had some small stool since then, comparable to Type 6 on [facility] Stool chart but not watery -- like a puddle.  No discomfort with BM or straining.  At baseline has bowel issues, in regard to regular pattern -- takes Hycoscyamine.  Last colonoscopy in 2009, has refused since then due to experience with 1st procedure.  In past she tried Lubiprostone & Linzess, but this did not offer benefit. ? ?Moderate stool noted on lumbar imaging 04/13/2021. ?Duration:weeks ?Alleviating factors: nothing ?Aggravating factors: unknown ?Status: worse ?Treatments attempted: Miralax ?Fever: no ?Nausea: no ?Vomiting: no ?Weight loss: no ?Decreased appetite: no ?Diarrhea:  no diarrhea, but leakage presenting ?Constipation: yes ?Blood in stool: no ?Heartburn: yes occasional ?Jaundice: no ?Rash: no ?Dysuria/urinary frequency: no ?Hematuria: no ?History of sexually transmitted disease: no ?Recurrent NSAID use: no  ? ?Relevant past medical, surgical, family and social history reviewed and updated as indicated. Interim medical history since our last visit reviewed. ?Allergies and medications reviewed and updated. ? ?Review of Systems   ?Constitutional:  Negative for activity change, appetite change, diaphoresis, fatigue and fever.  ?Respiratory:  Negative for cough, chest tightness, shortness of breath and wheezing.   ?Cardiovascular:  Negative for chest pain, palpitations and leg swelling.  ?Gastrointestinal:  Positive for constipation. Negative for abdominal distention, abdominal pain, diarrhea, nausea and vomiting.  ?Neurological: Negative.   ?Psychiatric/Behavioral:  Negative for decreased concentration, self-injury, sleep disturbance and suicidal ideas. The patient is not nervous/anxious.   ? ?Per HPI unless specifically indicated above ? ?   ?Objective:  ?  ?BP 106/71   Pulse 83   Temp 98.5 ?F (36.9 ?C) (Oral)   Ht 5' (1.524 m)   Wt 148 lb 9.6 oz (67.4 kg)   LMP  (LMP Unknown)   SpO2 99%   BMI 29.02 kg/m?   ?Wt Readings from Last 3 Encounters:  ?09/21/21 148 lb 9.6 oz (67.4 kg)  ?04/11/21 130 lb (59 kg)  ?03/29/21 151 lb 3.2 oz (68.6 kg)  ?  ?Physical Exam ?Vitals and nursing note reviewed.  ?Constitutional:   ?   General: She is awake. She is not in acute distress. ?   Appearance: She is well-developed and well-groomed. She is not ill-appearing or toxic-appearing.  ?HENT:  ?   Head: Normocephalic.  ?   Right Ear: Hearing normal.  ?   Left Ear: Hearing normal.  ?Eyes:  ?   General: Lids are normal.     ?   Right eye:  No discharge.     ?   Left eye: No discharge.  ?   Conjunctiva/sclera: Conjunctivae normal.  ?   Pupils: Pupils are equal, round, and reactive to light.  ?Neck:  ?   Thyroid: No thyromegaly.  ?   Vascular: No carotid bruit.  ?Cardiovascular:  ?   Rate and Rhythm: Normal rate and regular rhythm.  ?   Heart sounds: Normal heart sounds. No murmur heard. ?  No gallop.  ?Pulmonary:  ?   Effort: Pulmonary effort is normal. No accessory muscle usage or respiratory distress.  ?   Breath sounds: Normal breath sounds.  ?Abdominal:  ?   General: Bowel sounds are normal. There is distension.  ?   Palpations: Abdomen is soft.  ?    Tenderness: There is no abdominal tenderness.  ?   Hernia: No hernia is present.  ?Musculoskeletal:  ?   Cervical back: Normal range of motion and neck supple.  ?   Right lower leg: No edema.  ?   Left lower leg: No edema.  ?Skin: ?   General: Skin is warm and dry.  ?Neurological:  ?   Mental Status: She is alert and oriented to person, place, and time.  ?Psychiatric:     ?   Attention and Perception: Attention normal.     ?   Mood and Affect: Mood normal.     ?   Speech: Speech normal.     ?   Behavior: Behavior normal. Behavior is cooperative.     ?   Thought Content: Thought content normal.  ? ?Results for orders placed or performed during the hospital encounter of 04/11/21  ?Resp Panel by RT-PCR (Flu A&B, Covid) Nasopharyngeal Swab  ? Specimen: Nasopharyngeal Swab; Nasopharyngeal(NP) swabs in vial transport medium  ?Result Value Ref Range  ? SARS Coronavirus 2 by RT PCR POSITIVE (A) NEGATIVE  ? Influenza A by PCR NEGATIVE NEGATIVE  ? Influenza B by PCR NEGATIVE NEGATIVE  ?CBC  ?Result Value Ref Range  ? WBC 6.4 4.0 - 10.5 K/uL  ? RBC 4.38 4.22 - 5.81 MIL/uL  ? Hemoglobin 14.3 13.0 - 17.0 g/dL  ? HCT 43.2 39.0 - 52.0 %  ? MCV 98.6 80.0 - 100.0 fL  ? MCH 32.6 26.0 - 34.0 pg  ? MCHC 33.1 30.0 - 36.0 g/dL  ? RDW 12.8 11.5 - 15.5 %  ? Platelets 209 150 - 400 K/uL  ? nRBC 0.0 0.0 - 0.2 %  ?Comprehensive metabolic panel  ?Result Value Ref Range  ? Sodium 135 135 - 145 mmol/L  ? Potassium 4.3 3.5 - 5.1 mmol/L  ? Chloride 102 98 - 111 mmol/L  ? CO2 27 22 - 32 mmol/L  ? Glucose, Bld 116 (H) 70 - 99 mg/dL  ? BUN 12 8 - 23 mg/dL  ? Creatinine, Ser 0.95 0.61 - 1.24 mg/dL  ? Calcium 9.6 8.9 - 10.3 mg/dL  ? Total Protein 7.4 6.5 - 8.1 g/dL  ? Albumin 3.8 3.5 - 5.0 g/dL  ? AST 17 15 - 41 U/L  ? ALT 14 0 - 44 U/L  ? Alkaline Phosphatase 75 38 - 126 U/L  ? Total Bilirubin 0.6 0.3 - 1.2 mg/dL  ? GFR, Estimated >60 >60 mL/min  ? Anion gap 6 5 - 15  ? ?   ?Assessment & Plan:  ? ?Problem List Items Addressed This Visit   ? ?  ?  Digestive  ? IBS (irritable bowel syndrome) - Primary  ?  Chronic, ongoing with worsening at present -- no BM in 3 weeks and stool leakage present.  Poor response in past to Lubiprostone and Linzess.  At this time will trial Trulance 3 MG daily.  Obtain imaging, KUB.  Recommend today she take Mag Citrate as dosed on bottle OTC.  Will review imaging and suspect she will need to perform enema on self at home -- discussed with her.  Recommend increased hydration. Labs today.  Urgent referral to GI placed.  Higher risk due to smoking. Return in one week, sooner if worsening. ? ?  ?  ? Relevant Medications  ? Plecanatide (TRULANCE) 3 MG TABS  ? Other Relevant Orders  ? DG Abd 1 View  ? CBC with Differential/Platelet  ? Comprehensive metabolic panel  ? TSH  ? Ambulatory referral to Gastroenterology  ?  ? ?Follow up plan: ?Return in about 1 week (around 09/28/2021) for Constipation. ? ? ? ? ? ?

## 2021-09-21 NOTE — Patient Instructions (Signed)

## 2021-09-21 NOTE — Assessment & Plan Note (Addendum)
Chronic, ongoing with worsening at present -- no BM in 3 weeks and stool leakage present.  Poor response in past to Lubiprostone and Linzess.  At this time will trial Trulance 3 MG daily.  Obtain imaging, KUB.  Recommend today she take Mag Citrate as dosed on bottle OTC.  Will review imaging and suspect she will need to perform enema on self at home -- discussed with her.  Recommend increased hydration. Labs today.  Urgent referral to GI placed.  Higher risk due to smoking. Return in one week, sooner if worsening. ?

## 2021-09-22 LAB — CBC WITH DIFFERENTIAL/PLATELET
Basophils Absolute: 0.1 10*3/uL (ref 0.0–0.2)
Basos: 1 %
EOS (ABSOLUTE): 0.2 10*3/uL (ref 0.0–0.4)
Eos: 4 %
Hematocrit: 42.8 % (ref 37.5–51.0)
Hemoglobin: 14.9 g/dL (ref 13.0–17.7)
Immature Grans (Abs): 0 10*3/uL (ref 0.0–0.1)
Immature Granulocytes: 0 %
Lymphocytes Absolute: 1.8 10*3/uL (ref 0.7–3.1)
Lymphs: 32 %
MCH: 33.6 pg — ABNORMAL HIGH (ref 26.6–33.0)
MCHC: 34.8 g/dL (ref 31.5–35.7)
MCV: 96 fL (ref 79–97)
Monocytes Absolute: 0.5 10*3/uL (ref 0.1–0.9)
Monocytes: 9 %
Neutrophils Absolute: 3 10*3/uL (ref 1.4–7.0)
Neutrophils: 54 %
Platelets: 234 10*3/uL (ref 150–450)
RBC: 4.44 x10E6/uL (ref 4.14–5.80)
RDW: 12.2 % (ref 11.6–15.4)
WBC: 5.6 10*3/uL (ref 3.4–10.8)

## 2021-09-22 LAB — TSH: TSH: 4.28 u[IU]/mL (ref 0.450–4.500)

## 2021-09-22 LAB — COMPREHENSIVE METABOLIC PANEL
ALT: 11 IU/L (ref 0–44)
AST: 11 IU/L (ref 0–40)
Albumin/Globulin Ratio: 1.6 (ref 1.2–2.2)
Albumin: 4.2 g/dL (ref 3.8–4.8)
Alkaline Phosphatase: 81 IU/L (ref 44–121)
BUN/Creatinine Ratio: 12 (ref 10–24)
BUN: 12 mg/dL (ref 8–27)
Bilirubin Total: 0.3 mg/dL (ref 0.0–1.2)
CO2: 24 mmol/L (ref 20–29)
Calcium: 10.2 mg/dL (ref 8.6–10.2)
Chloride: 102 mmol/L (ref 96–106)
Creatinine, Ser: 0.98 mg/dL (ref 0.76–1.27)
Globulin, Total: 2.6 g/dL (ref 1.5–4.5)
Glucose: 88 mg/dL (ref 70–99)
Potassium: 4.6 mmol/L (ref 3.5–5.2)
Sodium: 137 mmol/L (ref 134–144)
Total Protein: 6.8 g/dL (ref 6.0–8.5)
eGFR: 83 mL/min/{1.73_m2} (ref >59)

## 2021-09-22 NOTE — Progress Notes (Signed)
Contacted via MyChart -- please call to see if she received below message:  Good afternoon Priscilla [name], your labs have returned and are all stable.  Have you gone for imaging yet?  Please ensure you go for this.  It is the most important part!!  Also ensure to maintain your visit with GI.  Any questions? Keep being stellar!!  Thank you for allowing me to participate in your care.  I appreciate you. Kindest regards, Charizma Gardiner

## 2021-09-23 ENCOUNTER — Encounter: Payer: Self-pay | Admitting: Nurse Practitioner

## 2021-09-23 NOTE — Progress Notes (Signed)
Contacted via MyChart -- but please call as not sure she checks MyChart: Good morning, Priscilla [name] your imaging has returned.  It is showing moderate retained stool on review of report and on viewing imaging.  I would continue as we discussed and would perform self enema if you have not passed stool yet.  There is no obstruction at this time, which is good news.  How are you feeling?

## 2021-09-25 NOTE — Patient Instructions (Incomplete)

## 2021-09-26 ENCOUNTER — Ambulatory Visit: Payer: Medicare Other | Admitting: Gastroenterology

## 2021-09-26 ENCOUNTER — Encounter: Payer: Self-pay | Admitting: Gastroenterology

## 2021-09-26 ENCOUNTER — Other Ambulatory Visit: Payer: Self-pay

## 2021-09-26 VITALS — BP 117/78 | HR 82 | Temp 98.5°F | Ht 60.0 in | Wt 149.5 lb

## 2021-09-26 DIAGNOSIS — K5909 Other constipation: Secondary | ICD-10-CM | POA: Diagnosis not present

## 2021-09-26 DIAGNOSIS — R194 Change in bowel habit: Secondary | ICD-10-CM | POA: Diagnosis not present

## 2021-09-26 MED ORDER — CLENPIQ 10-3.5-12 MG-GM -GM/160ML PO SOLN: 640.0000 mL | Freq: Once | ORAL | 0 refills | Status: AC

## 2021-09-26 NOTE — Patient Instructions (Signed)
Gave Linzess 72 samples  Do the Miralax bowel prep clean out now. Mix 32 ounces of Gatorade with 238 grams of miralax. Drink 8oz every 20 minutes till solution is gone.

## 2021-09-26 NOTE — Progress Notes (Signed)
Priscilla Repress, MD [address]  [address]  [place], [place] [address]  Main: [phone]  Fax: [phone]    Gastroenterology Consultation  Referring Provider:     Marjie Skiff, NP Primary Care Physician:  Priscilla Skiff, NP Primary Gastroenterologist:  Dr. Arlyss [name] Reason for Consultation:     Severe constipation        HPI:   Priscilla [name] is a 71 y.o. female referred by Dr. Marjie Skiff, NP  for consultation & management of severe constipation.  Patient reports that she has been suffering from irregular bowel habits almost all her life, however, for last 2 to 3 weeks, she has been experiencing severe constipation, associated with abdominal bloating.  She also notices that she is passing stool from 1 side of the rectum.  Patient was taking Linzess 145 mcg daily which resulted in severe diarrhea and could not go out.  This was later switched to Trulance, patient could not afford it.  Patient takes MiraLAX and Metamucil as needed only.  Patient reports severe abdominal bloating.  Patient does consume sweet tea regularly along with red meat.  Patient's last colonoscopy was more than 10 years ago.  NSAIDs: None  Antiplts/Anticoagulants/Anti thrombotics: None  GI Procedures: Colonoscopy more than 10 years ago  Past Medical History:  Diagnosis Date   Anxiety    Depression    Menopause     Past Surgical History:  Procedure Laterality Date   CHOLECYSTECTOMY     ovarian cyst rupture      Current Outpatient Medications:    amitriptyline (ELAVIL) 25 MG tablet, Take 2 tablets (50 mg total) by mouth at bedtime., Disp: 180 tablet, Rfl: 0   cholecalciferol (VITAMIN D) 1000 UNITS tablet, Take by mouth. Take 2 capsules once daily, Disp: , Rfl:    hyoscyamine (LEVBID) 0.375 MG 12 hr tablet, Take 1 tablet (0.375 mg total) by mouth every 12 (twelve) hours as needed., Disp: 90 tablet, Rfl: 2   NONFORMULARY OR COMPOUNDED ITEM, Take 125 mg by mouth daily.  Progesterone from [facility], Disp: 90 each, Rfl: 4   Sod Picosulfate-Mag Ox-Cit Acd (CLENPIQ) 10-3.5-12 MG-GM -GM/160ML SOLN, Take 640 mLs by mouth once for 1 dose., Disp: 640 mL, Rfl: 0    Family History  Problem Relation Age of Onset   Dementia Mother    Thyroid disease Mother    Hypertension Mother    Arthritis Mother    Diabetes Mother    Diabetes Father      Social History   Tobacco Use   Smoking status: Every Day    Packs/day: 0.50    Types: Cigarettes   Smokeless tobacco: Never  Vaping Use   Vaping Use: Never used  Substance Use Topics   Alcohol use: No    Alcohol/week: 0.0 standard drinks   Drug use: No    Allergies as of 09/26/2021   (No Known Allergies)    Review of Systems:    All systems reviewed and negative except where noted in HPI.   Physical Exam:  BP 117/78 (BP Location: Left Arm, Patient Position: Sitting, Cuff Size: Normal)   Pulse 82   Temp 98.5 F (36.9 C) (Oral)   Ht 5' (1.524 m)   Wt 149 lb 8 oz (67.8 kg)   LMP  (LMP Unknown)   BMI 29.20 kg/m  No LMP recorded (lmp unknown). Patient is postmenopausal.  General:   Alert,  Well-developed, well-nourished, pleasant and cooperative  in NAD Head:  Normocephalic and atraumatic. Eyes:  Sclera clear, no icterus.   Conjunctiva pink. Ears:  Normal auditory acuity. Nose:  No deformity, discharge, or lesions. Mouth:  No deformity or lesions,oropharynx pink & moist. Neck:  Supple; no masses or thyromegaly. Lungs:  Respirations even and unlabored.  Clear throughout to auscultation.   No wheezes, crackles, or rhonchi. No acute distress. Heart:  Regular rate and rhythm; no murmurs, clicks, rubs, or gallops. Abdomen:  Normal bowel sounds. Soft, obese, non-tender and non-distended without masses, hepatosplenomegaly or hernias noted.  No guarding or rebound tenderness.   Rectal: Not performed Msk:  Symmetrical without gross deformities. Good, equal movement & strength bilaterally. Pulses:  Normal  pulses noted. Extremities:  No clubbing or edema.  No cyanosis. Neurologic:  Alert and oriented x3;  grossly normal neurologically. Skin:  Intact without significant lesions or rashes. No jaundice. Psych:  Alert and cooperative. Normal mood and affect.  Imaging Studies: Reviewed  Assessment and Plan:   Priscilla [name] is a 71 y.o. female with history of chronic constipation is seen in consultation for change in stool caliber as well as worsening of constipation  Discussed with patient regarding diagnostic colonoscopy with 2-day prep and she is agreeable Discussed with patient to stop consumption of sweet tea, regular intake of red meat Discussed about high-fiber diet and fiber supplement such as Metamucil Recommend bowel cleanout and then start Linzess 72 mcg daily Patient tried Linzess daily in the past which resulted in severe diarrhea   Follow up in 4 to 6 months   Priscilla Repress, MD

## 2021-09-29 ENCOUNTER — Ambulatory Visit: Payer: Medicare Other | Admitting: Nurse Practitioner

## 2021-10-10 ENCOUNTER — Other Ambulatory Visit: Payer: Self-pay | Admitting: Gastroenterology

## 2021-10-10 ENCOUNTER — Other Ambulatory Visit: Payer: Self-pay

## 2021-10-10 ENCOUNTER — Telehealth: Payer: Self-pay

## 2021-10-10 MED ORDER — CLENPIQ 10-3.5-12 MG-GM -GM/175ML PO SOLN: 1.0000 | Freq: Once | ORAL | 0 refills | Status: AC

## 2021-10-10 NOTE — Progress Notes (Signed)
Pharmacy left a voicemail stating that they needed a new prescription for the Clenpiq to get the 2 day prep. Sent medication to the pharmacy for second box

## 2021-10-10 NOTE — Telephone Encounter (Signed)
Patient has colonoscopy schedule for Wednesday. She states that she only received one box from the pharmacy. Told her I would call the pharmacy to find out what is going on. Called the pharmacy and she states that the insurance company would only pay for 1 box at at time and she would have to pay another copay to get the second box. She states she will fill the medication. Called patient and she said that is fine she will go pick up the medication

## 2021-10-11 ENCOUNTER — Encounter: Payer: Self-pay | Admitting: Gastroenterology

## 2021-10-12 ENCOUNTER — Ambulatory Visit
Admission: RE | Admit: 2021-10-12 | Discharge: 2021-10-12 | Disposition: A | Discharge status: Home / Self Care | Payer: Medicare Other | Source: Ambulatory Visit | Attending: Gastroenterology | Admitting: Gastroenterology

## 2021-10-12 ENCOUNTER — Encounter
Admission: RE | Disposition: A | Discharge status: Home / Self Care | Payer: Self-pay | Source: Ambulatory Visit | Attending: Gastroenterology

## 2021-10-12 ENCOUNTER — Ambulatory Visit: Payer: Medicare Other | Admitting: Anesthesiology

## 2021-10-12 ENCOUNTER — Encounter: Payer: Self-pay | Admitting: Gastroenterology

## 2021-10-12 DIAGNOSIS — K5730 Diverticulosis of large intestine without perforation or abscess without bleeding: Secondary | ICD-10-CM | POA: Insufficient documentation

## 2021-10-12 DIAGNOSIS — F17210 Nicotine dependence, cigarettes, uncomplicated: Secondary | ICD-10-CM | POA: Diagnosis not present

## 2021-10-12 DIAGNOSIS — F32A Depression, unspecified: Secondary | ICD-10-CM | POA: Diagnosis not present

## 2021-10-12 DIAGNOSIS — F419 Anxiety disorder, unspecified: Secondary | ICD-10-CM | POA: Diagnosis not present

## 2021-10-12 DIAGNOSIS — R194 Change in bowel habit: Secondary | ICD-10-CM | POA: Diagnosis not present

## 2021-10-12 HISTORY — PX: COLONOSCOPY WITH PROPOFOL: SHX5780

## 2021-10-12 SURGERY — COLONOSCOPY WITH PROPOFOL
Anesthesia: General

## 2021-10-12 MED ORDER — PROPOFOL 500 MG/50ML IV EMUL
INTRAVENOUS | Status: DC | PRN
  Administered 2021-10-12: 150 ug/kg/min via INTRAVENOUS

## 2021-10-12 MED ORDER — PROPOFOL 10 MG/ML IV BOLUS
INTRAVENOUS | Status: DC | PRN
  Administered 2021-10-12: 50 mg via INTRAVENOUS

## 2021-10-12 MED ORDER — PROPOFOL 10 MG/ML IV BOLUS
INTRAVENOUS | Status: AC
  Filled 2021-10-12: qty 20

## 2021-10-12 MED ORDER — SODIUM CHLORIDE 0.9 % IV SOLN: INTRAVENOUS | Status: DC

## 2021-10-12 NOTE — Transfer of Care (Signed)
Immediate Anesthesia Transfer of Care Note  Patient: Priscilla [name]  Procedure(s) Performed: COLONOSCOPY WITH PROPOFOL  Patient Location: PACU  Anesthesia Type:General  Level of Consciousness: awake, alert  and oriented  Airway & Oxygen Therapy: Patient Spontanous Breathing  Post-op Assessment: Report given to RN and Post -op Vital signs reviewed and stable  Post vital signs: Reviewed and stable  Last Vitals:  Vitals Value Taken Time  BP 107/65 10/12/21 1127  Temp 35.8 C 10/12/21 1126  Pulse 79 10/12/21 1127  Resp 17 10/12/21 1128  SpO2 97 % 10/12/21 1127  Vitals shown include unvalidated device data.  Last Pain:  Vitals:   10/12/21 1126  TempSrc: Temporal  PainSc: Asleep         Complications: No notable events documented.

## 2021-10-12 NOTE — Anesthesia Preprocedure Evaluation (Addendum)
Anesthesia Evaluation  Patient identified by MRN, date of birth, ID band Patient awake    Reviewed: Allergy & Precautions, NPO status , Patient's Chart, lab work & pertinent test results  Airway Mallampati: II  TM Distance: >3 FB Neck ROM: full    Dental no notable dental hx.    Pulmonary Current Smoker and Patient abstained from smoking.,    Pulmonary exam normal        Cardiovascular negative cardio ROS Normal cardiovascular exam     Neuro/Psych PSYCHIATRIC DISORDERS Depression negative neurological ROS     GI/Hepatic negative GI ROS, Neg liver ROS,   Endo/Other  negative endocrine ROS  Renal/GU negative Renal ROS  negative genitourinary   Musculoskeletal   Abdominal Normal abdominal exam  (+)   Peds  Hematology negative hematology ROS (+)   Anesthesia Other Findings Past Medical History: No date: Anxiety No date: Depression No date: Menopause  Past Surgical History: No date: CHOLECYSTECTOMY No date: ovarian cyst rupture  BMI    Body Mass Index: 28.32 kg/m      Reproductive/Obstetrics negative OB ROS                            Anesthesia Physical Anesthesia Plan  ASA: 2  Anesthesia Plan: General   Post-op Pain Management:    Induction:   PONV Risk Score and Plan: Propofol infusion and TIVA  Airway Management Planned: Natural Airway  Additional Equipment:   Intra-op Plan:   Post-operative Plan:   Informed Consent: I have reviewed the patients History and Physical, chart, labs and discussed the procedure including the risks, benefits and alternatives for the proposed anesthesia with the patient or authorized representative who has indicated his/her understanding and acceptance.     Dental Advisory Given  Plan Discussed with: Anesthesiologist, CRNA and Surgeon  Anesthesia Plan Comments:       Anesthesia Quick Evaluation

## 2021-10-12 NOTE — H&P (Signed)
Arlyss Repress, MD [address]  [address]  [place], [place] [address]  Main: [phone]  Fax: [phone] Pager: [phone]  Primary Care Physician:  Marjie Skiff, NP Primary Gastroenterologist:  Dr. Arlyss Repress  Pre-Procedure History & Physical: HPI:  Priscilla [name] is a 71 y.o. female is here for an colonoscopy.   Past Medical History:  Diagnosis Date   Anxiety    Depression    Menopause     Past Surgical History:  Procedure Laterality Date   CHOLECYSTECTOMY     ovarian cyst rupture      Prior to Admission medications   Medication Sig Start Date End Date Taking? Authorizing Provider  amitriptyline (ELAVIL) 25 MG tablet Take 2 tablets (50 mg total) by mouth at bedtime. 07/27/21  Yes Cannady, Jolene T, NP  cholecalciferol (VITAMIN D) 1000 UNITS tablet Take by mouth. Take 2 capsules once daily   Yes [provider]  NONFORMULARY OR COMPOUNDED ITEM Take 125 mg by mouth daily. Progesterone from [facility] 05/10/21  Yes Cannady, Jolene T, NP  hyoscyamine (LEVBID) 0.375 MG 12 hr tablet Take 1 tablet (0.375 mg total) by mouth every 12 (twelve) hours as needed. 04/14/20   Aura Dials T, NP    Allergies as of 09/26/2021   (No Known Allergies)    Family History  Problem Relation Age of Onset   Dementia Mother    Thyroid disease Mother    Hypertension Mother    Arthritis Mother    Diabetes Mother    Diabetes Father     Social History   Socioeconomic History   Marital status: Married    Spouse name: Not on file   Number of children: Not on file   Years of education: Not on file   Highest education level: Not on file  Occupational History   Not on file  Tobacco Use   Smoking status: Every Day    Packs/day: 0.50    Types: Cigarettes   Smokeless tobacco: Never  Vaping Use   Vaping Use: Never used  Substance and Sexual Activity   Alcohol use: No    Alcohol/week: 0.0 standard drinks   Drug use: No   Sexual activity: Yes  Other Topics  Concern   Not on file  Social History Narrative   Not on file   Social Determinants of Health   Financial Resource Strain: Low Risk    Difficulty of Paying Living Expenses: Not hard at all  Food Insecurity: No Food Insecurity   Worried About Programme researcher, broadcasting/film/video in the Last Year: Never true   Ran Out of Food in the Last Year: Never true  Transportation Needs: No Transportation Needs   Lack of Transportation (Medical): No   Lack of Transportation (Non-Medical): No  Physical Activity: Sufficiently Active   Days of Exercise per Week: 6 days   Minutes of Exercise per Session: 30 min  Stress: No Stress Concern Present   Feeling of Stress : Not at all  Social Connections: Moderately Isolated   Frequency of Communication with Friends and Family: Twice a week   Frequency of Social Gatherings with Friends and Family: Once a week   Attends Religious Services: Never   Database administrator or Organizations: No   Attends Engineer, structural: Never   Marital Status: Married  Catering manager Violence: Not At Risk   Fear of Current or Ex-Partner: No   Emotionally Abused: No   Physically Abused: No  Sexually Abused: No    Review of Systems: See HPI, otherwise negative ROS  Physical Exam: BP 120/70   Pulse 85   Temp (!) 96.6 F (35.9 C) (Temporal)   Resp 20   Ht 5' (1.524 m)   Wt 65.8 kg   LMP  (LMP Unknown)   SpO2 99%   BMI 28.32 kg/m  General:   Alert,  pleasant and cooperative in NAD Head:  Normocephalic and atraumatic. Neck:  Supple; no masses or thyromegaly. Lungs:  Clear throughout to auscultation.    Heart:  Regular rate and rhythm. Abdomen:  Soft, nontender and nondistended. Normal bowel sounds, without guarding, and without rebound.   Neurologic:  Alert and  oriented x4;  grossly normal neurologically.  Impression/Plan: Priscilla [name] is here for an colonoscopy to be performed for change in stool caliber  Risks, benefits, limitations, and alternatives  regarding  colonoscopy have been reviewed with the patient.  Questions have been answered.  All parties agreeable.   Lannette Donath, MD  10/12/2021, 10:49 AM

## 2021-10-12 NOTE — Op Note (Signed)
[facility] Gastroenterology Patient Name: Priscilla [name] Procedure Date: 10/12/2021 10:54 AM MRN: [id number] Account #: [id number] Date of Birth: [birth year] Admit Type: Outpatient Age: 71 Room: [facility] ENDO ROOM 4 Gender: Female Note Status: Finalized Instrument Name: Prentice Docker [id number] Procedure:             Colonoscopy Indications:           Last colonoscopy 10 years ago, Change in stool caliber Providers:             Toney Reil MD, MD Medicines:             General Anesthesia Complications:         No immediate complications. Estimated blood loss: None. Procedure:             Pre-Anesthesia Assessment:                        - Prior to the procedure, a History and Physical was                         performed, and patient medications and allergies were                         reviewed. The patient is competent. The risks and                         benefits of the procedure and the sedation options and                         risks were discussed with the patient. All questions                         were answered and informed consent was obtained.                         Patient identification and proposed procedure were                         verified by the physician, the nurse, the                         anesthesiologist, the anesthetist and the technician                         in the pre-procedure area in the procedure room in the                         endoscopy suite. Mental Status Examination: alert and                         oriented. Airway Examination: normal oropharyngeal                         airway and neck mobility. Respiratory Examination:                         clear to auscultation. CV Examination: normal.  Prophylactic Antibiotics: The patient does not require                         prophylactic antibiotics. Prior Anticoagulants: The                         patient has taken no previous anticoagulant or                          antiplatelet agents. ASA Grade Assessment: II - A                         patient with mild systemic disease. After reviewing                         the risks and benefits, the patient was deemed in                         satisfactory condition to undergo the procedure. The                         anesthesia plan was to use general anesthesia.                         Immediately prior to administration of medications,                         the patient was re-assessed for adequacy to receive                         sedatives. The heart rate, respiratory rate, oxygen                         saturations, blood pressure, adequacy of pulmonary                         ventilation, and response to care were monitored                         throughout the procedure. The physical status of the                         patient was re-assessed after the procedure.                        After obtaining informed consent, the colonoscope was                         passed under direct vision. Throughout the procedure,                         the patient's blood pressure, pulse, and oxygen                         saturations were monitored continuously. The                         Colonoscope was introduced through the anus and  advanced to the the cecum, identified by appendiceal                         orifice and ileocecal valve. The colonoscopy was                         technically difficult and complex due to multiple                         diverticula in the colon and poor bowel prep.                         Successful completion of the procedure was aided by                         withdrawing the scope and replacing with the pediatric                         colonoscope and applying abdominal pressure. The                         patient tolerated the procedure well. The quality of                         the bowel preparation was  poor. Findings:      The perianal and digital rectal examinations were normal. Pertinent       negatives include normal sphincter tone and no palpable rectal lesions.      Copious quantities of semi-liquid semi-solid stool was found in the       entire colon, precluding visualization.      The retroflexed view of the distal rectum and anal verge was normal and       showed no anal or rectal abnormalities.      Multiple diverticula were found in the entire colon. There was no       evidence of diverticular bleeding. Impression:            - Preparation of the colon was poor.                        - Stool in the entire examined colon.                        - The distal rectum and anal verge are normal on                         retroflexion view.                        - Severe diverticulosis in the entire examined colon.                         There was no evidence of diverticular bleeding.                        - No specimens collected. Recommendation:        - Discharge patient to home (with escort).                        -  Resume previous diet today.                        - Continue present medications. Procedure Code(s):     --- Professional ---                        [phone], Colonoscopy, flexible; diagnostic, including                         collection of specimen(s) by brushing or washing, when                         performed (separate procedure) Diagnosis Code(s):     --- Professional ---                        R19.5, Other fecal abnormalities                        K57.30, Diverticulosis of large intestine without                         perforation or abscess without bleeding CPT copyright 2019 American Medical Association. All rights reserved. The codes documented in this report are preliminary and upon coder review may  be revised to meet current compliance requirements. Dr. Libby Maw Toney Reil MD, MD 10/12/2021 11:25:43 AM This report has been signed  electronically. Number of Addenda: 0 Note Initiated On: 10/12/2021 10:54 AM Scope Withdrawal Time: 0 hours 2 minutes 56 seconds  Total Procedure Duration: 0 hours 17 minutes 17 seconds  Estimated Blood Loss:  Estimated blood loss: none.      [facility]

## 2021-10-13 ENCOUNTER — Encounter: Payer: Self-pay | Admitting: Gastroenterology

## 2021-10-13 NOTE — Anesthesia Postprocedure Evaluation (Signed)
Anesthesia Post Note  Patient: Priscilla [name]  Procedure(s) Performed: COLONOSCOPY WITH PROPOFOL  Patient location during evaluation: Endoscopy Anesthesia Type: General Level of consciousness: awake and alert Pain management: pain level controlled Vital Signs Assessment: post-procedure vital signs reviewed and stable Respiratory status: spontaneous breathing, nonlabored ventilation and respiratory function stable Cardiovascular status: blood pressure returned to baseline and stable Postop Assessment: no apparent nausea or vomiting Anesthetic complications: no   No notable events documented.   Last Vitals:  Vitals:   10/12/21 1136 10/12/21 1146  BP: 115/65 125/61  Pulse: 77 72  Resp: 15 17  Temp:    SpO2: 100% 100%    Last Pain:  Vitals:   10/12/21 1146  TempSrc:   PainSc: 0-No pain                 Foye Deer

## 2021-10-25 ENCOUNTER — Other Ambulatory Visit: Payer: Self-pay | Admitting: Nurse Practitioner

## 2021-10-25 ENCOUNTER — Telehealth: Payer: Self-pay

## 2021-10-25 MED ORDER — LINACLOTIDE 72 MCG PO CAPS: 72.0000 ug | ORAL_CAPSULE | Freq: Every day | ORAL | 5 refills | Status: DC

## 2021-10-25 NOTE — Telephone Encounter (Signed)
Sent medication to [facility]

## 2021-10-25 NOTE — Telephone Encounter (Signed)
Please do  RV 

## 2021-10-25 NOTE — Telephone Encounter (Signed)
Patient is calling because she was given samples of Linzess . She states that Dr. Allegra Lai told her to try to take 2 tablets daily but she can only do that on weekends not during the week when she is working. She wants to a prescription sent in for Linzess 72 take 1 tablet daily. Please advise if this can be done to [facility]

## 2021-10-25 NOTE — Addendum Note (Signed)
Addended by: Radene Knee L on: 10/25/2021 09:42 AM   Modules accepted: Orders

## 2021-10-26 NOTE — Telephone Encounter (Signed)
Requested medication (s) are due for refill today: unknown  Requested medication (s) are on the active medication list: no  Last refill:  unknown  Future visit scheduled: no  Notes to clinic: Unable to refill per protocol, Rx expired.Medication is not on current medication list.      Requested Prescriptions  Pending Prescriptions Disp Refills   amitriptyline (ELAVIL) 25 MG tablet [Pharmacy Med Name: AMITRIPTYLINE HCL 25 MG TAB] 180 tablet 0    Sig: Take 2 tablets (50 mg total) by mouth at bedtime.     Psychiatry:  Antidepressants - Heterocyclics (TCAs) Passed - 10/25/2021  3:20 PM      Passed - Completed PHQ-2 or PHQ-9 in the last 360 days      Passed - Valid encounter within last 6 months    Recent Outpatient Visits           1 month ago Irritable bowel syndrome with constipation   [facility] [place], Jolene T, NP   6 months ago Chronic midline low back pain without sciatica   [facility] [place], Jolene T, NP   7 months ago Recurrent major depressive disorder, in partial remission (HCC)   [facility] [place], [place] T, NP   1 year ago Left foot pain   [facility] [place], [place] T, NP   1 year ago Elevated LDL cholesterol level   [facility] [place], Dorie Rank, NP

## 2021-10-31 ENCOUNTER — Encounter: Payer: Self-pay | Admitting: Nurse Practitioner

## 2022-05-09 ENCOUNTER — Other Ambulatory Visit: Payer: Self-pay | Admitting: Nurse Practitioner

## 2022-05-09 ENCOUNTER — Other Ambulatory Visit: Payer: Self-pay | Admitting: Gastroenterology

## 2022-05-09 ENCOUNTER — Encounter: Payer: Self-pay | Admitting: Nurse Practitioner

## 2022-05-09 ENCOUNTER — Ambulatory Visit (INDEPENDENT_AMBULATORY_CARE_PROVIDER_SITE_OTHER): Payer: Medicare Other | Admitting: Nurse Practitioner

## 2022-05-09 VITALS — BP 120/79 | HR 97 | Temp 98.1°F | Ht 60.0 in | Wt 164.0 lb

## 2022-05-09 DIAGNOSIS — K581 Irritable bowel syndrome with constipation: Secondary | ICD-10-CM

## 2022-05-09 DIAGNOSIS — M545 Low back pain, unspecified: Secondary | ICD-10-CM

## 2022-05-09 DIAGNOSIS — F17210 Nicotine dependence, cigarettes, uncomplicated: Secondary | ICD-10-CM | POA: Diagnosis not present

## 2022-05-09 DIAGNOSIS — F3341 Major depressive disorder, recurrent, in partial remission: Secondary | ICD-10-CM | POA: Diagnosis not present

## 2022-05-09 DIAGNOSIS — Z780 Asymptomatic menopausal state: Secondary | ICD-10-CM | POA: Diagnosis not present

## 2022-05-09 DIAGNOSIS — I700 Atherosclerosis of aorta: Secondary | ICD-10-CM

## 2022-05-09 MED ORDER — NONFORMULARY OR COMPOUNDED ITEM: 125.0000 mg | Freq: Every day | 4 refills | Status: DC

## 2022-05-09 MED ORDER — LINACLOTIDE 72 MCG PO CAPS
72.0000 ug | ORAL_CAPSULE | Freq: Every day | ORAL | 4 refills | Status: DC
Start: 2022-05-09 — End: 2022-05-09

## 2022-05-09 MED ORDER — LIDOCAINE 5 % EX PTCH: 1.0000 | MEDICATED_PATCH | CUTANEOUS | 3 refills | Status: DC

## 2022-05-09 MED ORDER — LINACLOTIDE 72 MCG PO CAPS
72.0000 ug | ORAL_CAPSULE | Freq: Every day | ORAL | 4 refills | Status: DC
Start: 2022-05-09 — End: 2023-07-31

## 2022-05-09 NOTE — Assessment & Plan Note (Signed)
Chronic, ongoing with ongoing struggle with aging.  She wishes to continue Amitriptyline at this time, we tried Wellbutrin in past without benefit.  May benefit from SSRI, but she is concerned about weight gain.  Recommend she look into pool aerobics at [facility] which may benefit both mood and arthritis.

## 2022-05-09 NOTE — Assessment & Plan Note (Signed)
Noted on imaging 04/13/21.  May benefit from statin in future, refuses at this time.  Recommend complete cessation of smoking.

## 2022-05-09 NOTE — Patient Instructions (Signed)

## 2022-05-09 NOTE — Assessment & Plan Note (Signed)
Chronic, ongoing, stable with Linzess.  Will continue this and send in refills.

## 2022-05-09 NOTE — Progress Notes (Signed)
BP 120/79   Pulse 97   Temp 98.1 F (36.7 C) (Oral)   Ht 5' (1.524 m)   Wt 164 lb (74.4 kg)   LMP  (LMP Unknown)   SpO2 97%   BMI 32.03 kg/m    Subjective:    Patient ID: Priscilla [name], female    DOB: 22-Sep-1950, 72 y.o.   MRN: [id number]  HPI: Priscilla [name] is a 72 y.o. female  Chief Complaint  Patient presents with   Medication Refill    Would like to also talk about the [place] from Dr. Dena Billet BOWEL SYNDROME Has colonoscopy on 10/12/21.  Was taking Linzess 86 MCG, but she reports no refills on this. This offers her benefit with less cramping.   History of aortic atherosclerosis on past imaging. Treatments attempted: Linzess -- this is working well for her Fever: no Nausea: no Vomiting: no Weight loss: no Decreased appetite: no Diarrhea: no Constipation: no Blood in stool: no Heartburn: no Jaundice: no Rash: no  DEPRESSION Continues on Amitriptyline, has been on this for years and does not wish to change.  She is not happy at this time with her weight gain, which presented after having fracture in 2022 + her back is still not at 100% -- some degenerative disc present past imaging.  Has seen chiropractor for this with benefit in past.  Takes Tylenol for this, but does not help much.  She also endorses some sadness with aging, 52 was a difficult age change for her.  Continues to smoke cigarettes daily.  Continues on Progesterone for menopause symptoms. Mood status: stable Satisfied with current treatment?: yes Symptom severity: moderate  Duration of current treatment : chronic Side effects: no Medication compliance: good compliance Psychotherapy/counseling: none Depressed mood: yes with recent transition Anxious mood: no Anhedonia: no Significant weight loss or gain: yes Insomnia: yes hard to fall asleep Fatigue: no Feelings of worthlessness or guilt: no Impaired concentration/indecisiveness: no Suicidal ideations: no Hopelessness: no Crying  spells: no    05/09/2022    4:07 PM 09/21/2021    8:19 AM 03/29/2021    8:19 AM 02/03/2021    5:54 PM 04/14/2020   11:46 AM  Depression screen PHQ 2/9  Decreased Interest 1 0 3 0 0  Down, Depressed, Hopeless 0 0 3 0 0  PHQ - 2 Score 1 0 6 0 0  Altered sleeping 0 0 0  1  Tired, decreased energy _0 Change in appetite _1 0  Feeling bad or failure about yourself  0 1   0  Trouble concentrating 0 0 0  0  Moving slowly or fidgety/restless 0 0 0  0  Suicidal thoughts 0 0 0  0  PHQ-9 Score _2 Difficult doing work/chores Not difficult at all  Somewhat difficult  Not difficult at all       05/09/2022    4:07 PM 09/21/2021    8:19 AM 03/29/2021    8:21 AM 04/01/2019   11:05 AM  GAD 7 : Generalized Anxiety Score  Nervous, Anxious, on Edge 0 0 0 0  Control/stop worrying 0 0 0 0  Worry too much - different things 1 0 0 0  Trouble relaxing 0 0 0 0  Restless 0 1 0 0  Easily annoyed or irritable _3 0  Afraid - awful might happen 0 0 0 0  Total GAD 7 Score 2  2 3 0  Anxiety Difficulty Not difficult at all  Somewhat difficult Not difficult at all    Relevant past medical, surgical, family and social history reviewed and updated as indicated. Interim medical history since our last visit reviewed. Allergies and medications reviewed and updated.  Review of Systems  Constitutional:  Negative for activity change, appetite change, diaphoresis, fatigue and fever.  Respiratory:  Negative for cough, chest tightness and shortness of breath.   Cardiovascular:  Negative for chest pain, palpitations and leg swelling.  Gastrointestinal: Negative.   Neurological: Negative.   Psychiatric/Behavioral: Negative.     Per HPI unless specifically indicated above     Objective:    BP 120/79   Pulse 97   Temp 98.1 F (36.7 C) (Oral)   Ht 5' (1.524 m)   Wt 164 lb (74.4 kg)   LMP  (LMP Unknown)   SpO2 97%   BMI 32.03 kg/m   Wt Readings from Last 3 Encounters:  05/09/22 164 lb (74.4  kg)  10/12/21 145 lb (65.8 kg)  09/26/21 149 lb 8 oz (67.8 kg)    Physical Exam Vitals and nursing note reviewed.  Constitutional:      General: She is awake. She is not in acute distress.    Appearance: She is well-developed and well-groomed. She is not ill-appearing or toxic-appearing.  HENT:     Head: Normocephalic.     Right Ear: Hearing normal.     Left Ear: Hearing normal.  Eyes:     General: Lids are normal.        Right eye: No discharge.        Left eye: No discharge.     Conjunctiva/sclera: Conjunctivae normal.     Pupils: Pupils are equal, round, and reactive to light.  Neck:     Thyroid: No thyromegaly.     Vascular: No carotid bruit.  Cardiovascular:     Rate and Rhythm: Normal rate and regular rhythm.     Heart sounds: Normal heart sounds. No murmur heard.    No gallop.  Pulmonary:     Effort: Pulmonary effort is normal. No accessory muscle usage or respiratory distress.     Breath sounds: Normal breath sounds.  Abdominal:     General: Bowel sounds are normal. There is no distension.     Palpations: Abdomen is soft.     Tenderness: There is no abdominal tenderness.     Hernia: No hernia is present.  Musculoskeletal:     Cervical back: Normal range of motion and neck supple.     Right lower leg: No edema.     Left lower leg: No edema.  Skin:    General: Skin is warm and dry.  Neurological:     Mental Status: She is alert and oriented to person, place, and time.  Psychiatric:        Attention and Perception: Attention normal.        Mood and Affect: Mood normal.        Speech: Speech normal.        Behavior: Behavior normal. Behavior is cooperative.        Thought Content: Thought content normal.     Results for orders placed or performed in visit on 09/21/21  CBC with Differential/Platelet  Result Value Ref Range   WBC 5.6 3.4 - 10.8 x10E3/uL   RBC 4.44 4.14 - 5.80 x10E6/uL   Hemoglobin 14.9 13.0 - 17.7 g/dL   Hematocrit 42.8 37.5 - 51.0 %  MCV  96 79 - 97 fL   MCH 33.6 (H) 26.6 - 33.0 pg   MCHC 34.8 31.5 - 35.7 g/dL   RDW 12.2 11.6 - 15.4 %   Platelets 234 150 - 450 x10E3/uL   Neutrophils 54 Not Estab. %   Lymphs 32 Not Estab. %   Monocytes 9 Not Estab. %   Eos 4 Not Estab. %   Basos 1 Not Estab. %   Neutrophils Absolute 3.0 1.4 - 7.0 x10E3/uL   Lymphocytes Absolute 1.8 0.7 - 3.1 x10E3/uL   Monocytes Absolute 0.5 0.1 - 0.9 x10E3/uL   EOS (ABSOLUTE) 0.2 0.0 - 0.4 x10E3/uL   Basophils Absolute 0.1 0.0 - 0.2 x10E3/uL   Immature Granulocytes 0 Not Estab. %   Immature Grans (Abs) 0.0 0.0 - 0.1 x10E3/uL  Comprehensive metabolic panel  Result Value Ref Range   Glucose 88 70 - 99 mg/dL   BUN 12 8 - 27 mg/dL   Creatinine, Ser 0.98 0.76 - 1.27 mg/dL   eGFR 83 >59 mL/min/1.73   BUN/Creatinine Ratio 12 10 - 24   Sodium 137 134 - 144 mmol/L   Potassium 4.6 3.5 - 5.2 mmol/L   Chloride 102 96 - 106 mmol/L   CO2 24 20 - 29 mmol/L   Calcium 10.2 8.6 - 10.2 mg/dL   Total Protein 6.8 6.0 - 8.5 g/dL   Albumin 4.2 3.8 - 4.8 g/dL   Globulin, Total 2.6 1.5 - 4.5 g/dL   Albumin/Globulin Ratio 1.6 1.2 - 2.2   Bilirubin Total 0.3 0.0 - 1.2 mg/dL   Alkaline Phosphatase 81 44 - 121 IU/L   AST 11 0 - 40 IU/L   ALT 11 0 - 44 IU/L  TSH  Result Value Ref Range   TSH 4.280 0.450 - 4.500 uIU/mL      Assessment & Plan:   Problem List Items Addressed This Visit       Cardiovascular and Mediastinum   Aortic atherosclerosis ([place])    Noted on imaging 04/13/21.  May benefit from statin in future, refuses at this time.  Recommend complete cessation of smoking.        Digestive   IBS (irritable bowel syndrome)    Chronic, ongoing, stable with Linzess.  Will continue this and send in refills.      Relevant Medications   linaclotide (LINZESS) 72 MCG capsule     Other   Depression - Primary    Chronic, ongoing with ongoing struggle with aging.  She wishes to continue Amitriptyline at this time, we tried Wellbutrin in past without benefit.   May benefit from SSRI, but she is concerned about weight gain.  Recommend she look into pool aerobics at [facility] which may benefit both mood and arthritis.      Low back pain    Ongoing since having to wear boot in July 2022.  No red flags.  Recommend she look into starting water aerobics at [facility] which may benefit pain and mood.  May take Ibuprofen or Tylenol as needed.  Lidocaine patches sent in to use as needed.      Menopause    Chronic, stable on current medication regimen (compounded Progesterone 125MG daily).  Continue current regimen.  Script to be faxed to pharmacy in [place].  She is aware of long term risks of hormone use and refuses to taper off.      Nicotine dependence, cigarettes, uncomplicated    I have recommended complete cessation of tobacco use. I have discussed  various options available for assistance with tobacco cessation including over the counter methods (Nicotine gum, patch and lozenges). We also discussed prescription options (Chantix, Nicotine Inhaler / Nasal Spray). The patient is not interested in pursuing any prescription tobacco cessation options at this time.         Follow up plan: Return in about 6 months (around 11/07/2022) for [place].

## 2022-05-09 NOTE — Assessment & Plan Note (Signed)
I have recommended complete cessation of tobacco use. I have discussed various options available for assistance with tobacco cessation including over the counter methods (Nicotine gum, patch and lozenges). We also discussed prescription options (Chantix, Nicotine Inhaler / Nasal Spray). The patient is not interested in pursuing any prescription tobacco cessation options at this time.  

## 2022-05-09 NOTE — Assessment & Plan Note (Signed)
Chronic, stable on current medication regimen (compounded Progesterone 125MG  daily).  Continue current regimen.  Script to be faxed to pharmacy in [place].  She is aware of long term risks of hormone use and refuses to taper off.

## 2022-05-09 NOTE — Assessment & Plan Note (Signed)
Ongoing since having to wear boot in July 2022.  No red flags.  Recommend she look into starting water aerobics at [facility] which may benefit pain and mood.  May take Ibuprofen or Tylenol as needed.  Lidocaine patches sent in to use as needed.

## 2022-05-10 NOTE — Telephone Encounter (Signed)
No longer current dosing of this medication Requested Prescriptions  Pending Prescriptions Disp Refills   LINZESS 145 MCG CAPS capsule [Pharmacy Med Name: LINZESS 145 MCG CAPSULE] 30 capsule 0    Sig: Take 1 capsule (145 mcg total) by mouth daily before breakfast.     Gastroenterology: Irritable Bowel Syndrome Passed - 05/09/2022  2:11 PM      Passed - Valid encounter within last 12 months    Recent Outpatient Visits           Yesterday Recurrent major depressive disorder, in partial remission ([place])   [place], Jolene T, NP   7 months ago Irritable bowel syndrome with constipation   [place], [place] T, NP   1 year ago Chronic midline low back pain without sciatica   [place], Jolene T, NP   1 year ago Recurrent major depressive disorder, in partial remission ([place])   [place], Henrine Screws T, NP   1 year ago Left foot pain   [place], Barbaraann Faster, NP

## 2022-09-28 IMAGING — DX DG LUMBAR SPINE COMPLETE 4+V
5 series · 5 of 5 positions shown · non-contrast
Comparison: None.

CLINICAL DATA: Back pain.

EXAM:
LUMBAR SPINE - COMPLETE 4+ VIEW

[l-spine obl (1 of 2)]
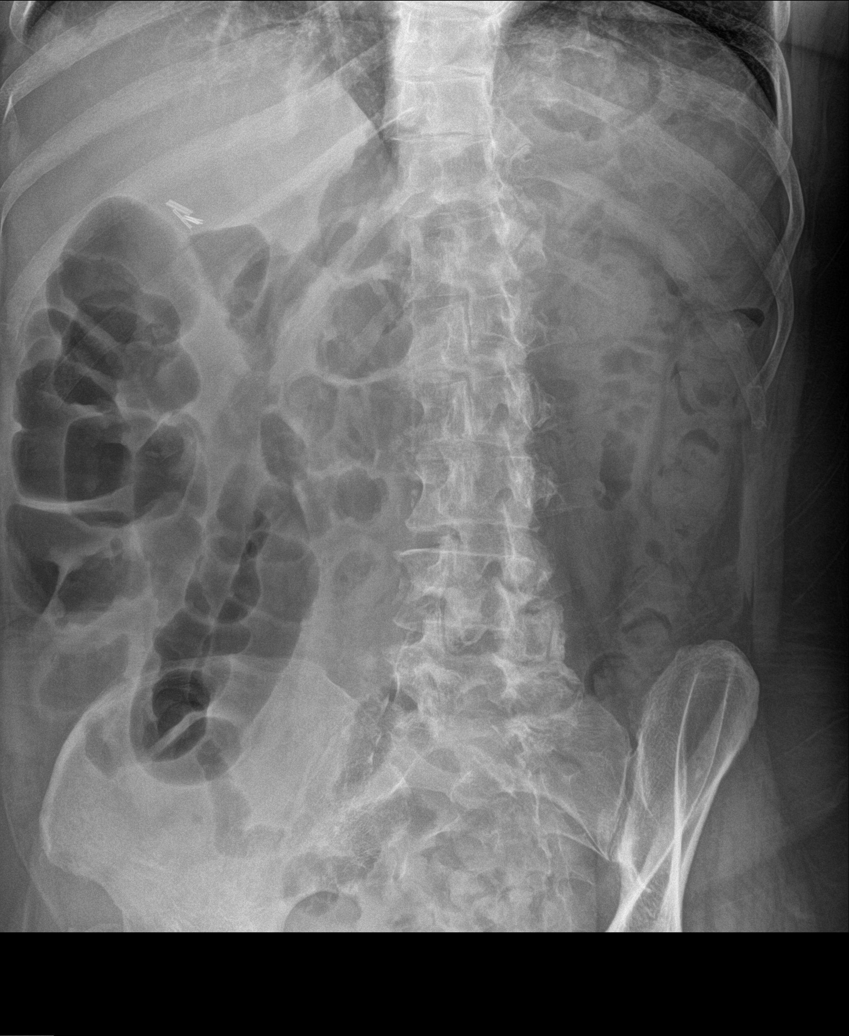

[l-spine obl (2 of 2)]
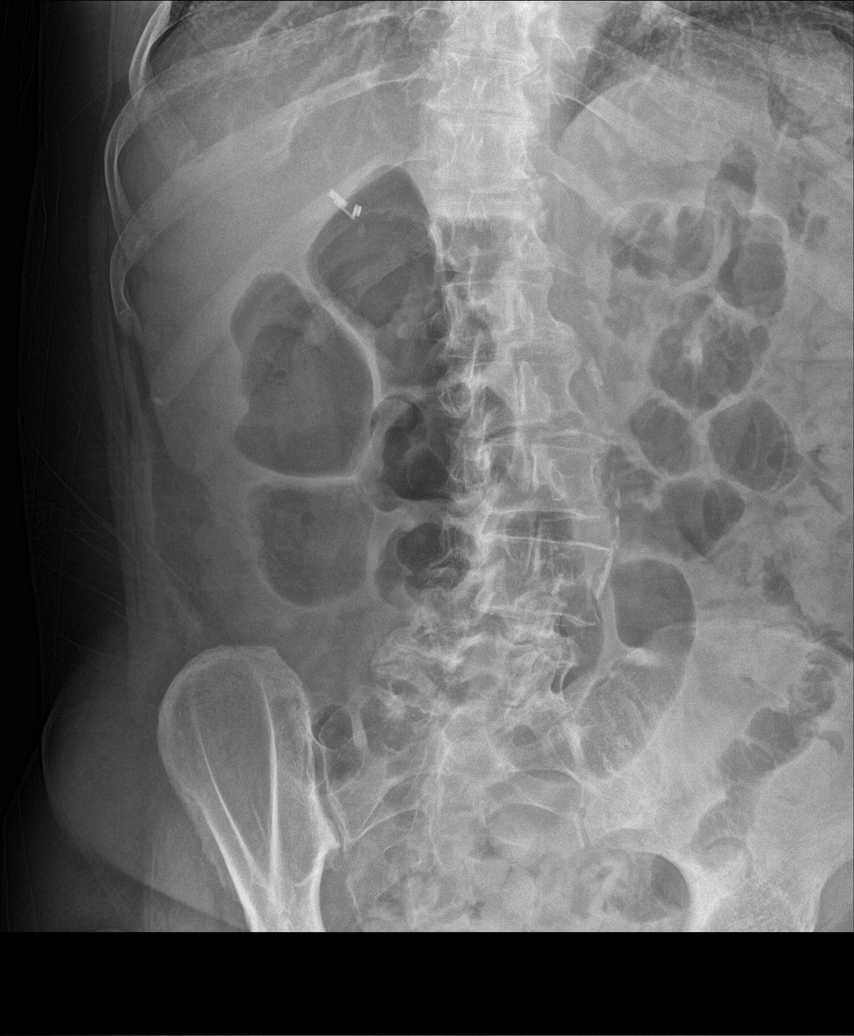

[l-spine lat]
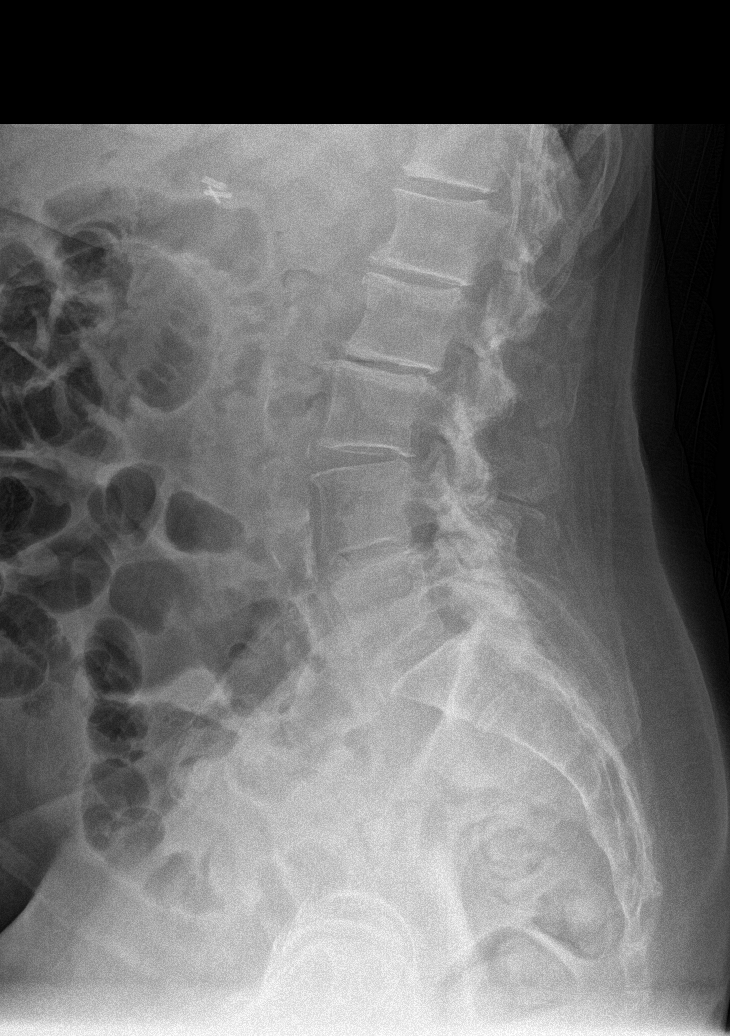

[l-spine spot]
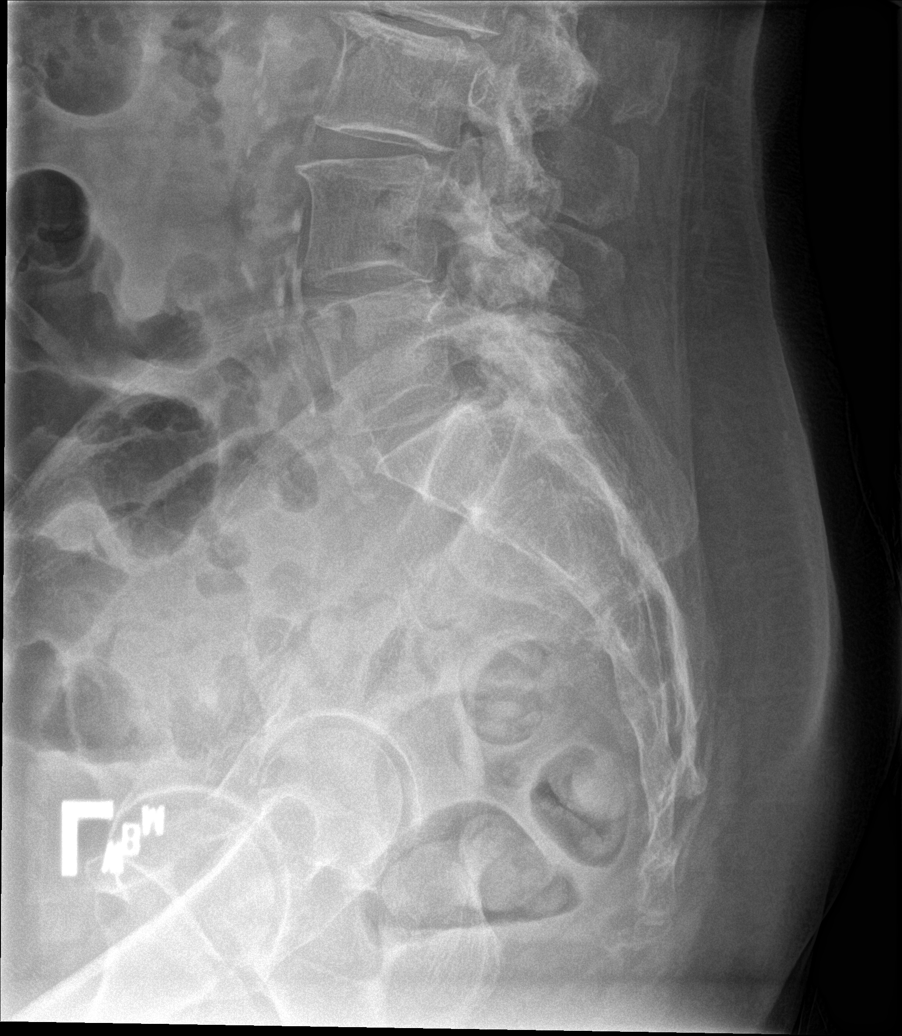

[l-spine ap]
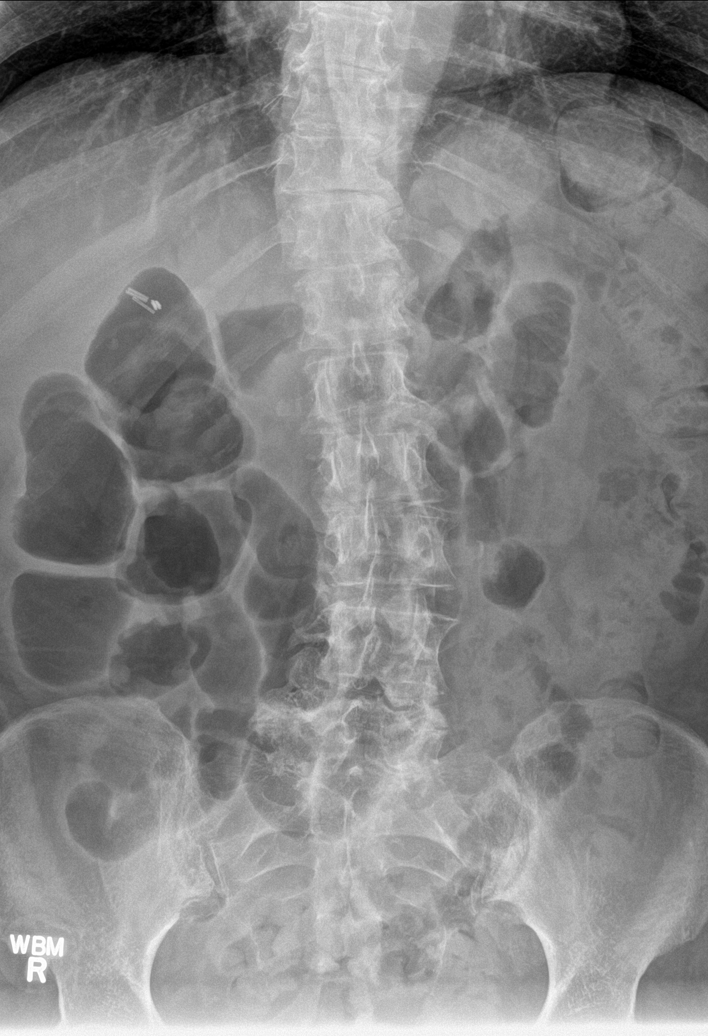

[5 of 5 positions shown; findings below may reference images not displayed]

FINDINGS: Five lumbar type vertebra. There is no acute fracture or subluxation
of the lumbar spine. Multilevel degenerative changes with mild the
bow scoliosis. Multilevel facet arthropathy. There is
atherosclerotic calcification of the abdominal aorta. Right upper
quadrant cholecystectomy clips. Moderate stool throughout the colon.
The soft tissues are unremarkable.
IMPRESSION: No acute/traumatic lumbar spine pathology.

## 2022-10-17 ENCOUNTER — Ambulatory Visit: Payer: Medicare Other | Admitting: Nurse Practitioner

## 2022-10-22 NOTE — Patient Instructions (Incomplete)
Wegovy, Zepbound, Contrave, Orlistat  Managing Depression, Adult Depression is a mental health condition that affects your thoughts, feelings, and actions. Being diagnosed with depression can bring you relief if you did not know why you have felt or behaved a certain way. It could also leave you feeling overwhelmed. Finding ways to manage your symptoms can help you feel more positive about your future. How to manage lifestyle changes Being depressed is difficult. Depression can increase the level of everyday stress. Stress can make depression symptoms worse. You may believe your symptoms cannot be managed or will never improve. However, there are many things you can try to help manage your symptoms. There is hope. Managing stress  Stress is your body's reaction to life changes and events, both good and bad. Stress can add to your feelings of depression. Learning to manage your stress can help lessen your feelings of depression. Try some of the following approaches to reducing your stress (stress reduction techniques): Listen to music that you enjoy and that inspires you. Try using a meditation app or take a meditation class. Develop a practice that helps you connect with your spiritual self. Walk in nature, pray, or go to a place of worship. Practice deep breathing. To do this, inhale slowly through your nose. Pause at the top of your inhale for a few seconds and then exhale slowly, letting yourself relax. Repeat this three or four times. Practice yoga to help relax and work your muscles. Choose a stress reduction technique that works for you. These techniques take time and practice to develop. Set aside 5-15 minutes a day to do them. Therapists can offer training in these techniques. Do these things to help manage stress: Keep a journal. Know your limits. Set healthy boundaries for yourself and others, such as saying "no" when you think something is too much. Pay attention to how you react to  certain situations. You may not be able to control everything, but you can change your reaction. Add humor to your life by watching funny movies or shows. Make time for activities that you enjoy and that relax you. Spend less time using electronics, especially at night before bed. The light from screens can make your brain think it is time to get up rather than go to bed.  Medicines Medicines, such as antidepressants, are often a part of treatment for depression. Talk with your pharmacist or health care provider about all the medicines, supplements, and herbal products that you take, their possible side effects, and what medicines and other products are safe to take together. Make sure to report any side effects you may have to your health care provider. Relationships Your health care provider may suggest family therapy, couples therapy, or individual therapy as part of your treatment. How to recognize changes Everyone responds differently to treatment for depression. As you recover from depression, you may start to: Have more interest in doing activities. Feel more hopeful. Have more energy. Eat a more regular amount of food. Have better mental focus. It is important to recognize if your depression is not getting better or is getting worse. The symptoms you had in the beginning may return, such as: Feeling tired. Eating too much or too little. Sleeping too much or too little. Feeling restless, agitated, or hopeless. Trouble focusing or making decisions. Having unexplained aches and pains. Feeling irritable, angry, or aggressive. If you or your family members notice these symptoms coming back, let your health care provider know right away. Follow these instructions  at home: Activity Try to get some form of exercise each day, such as walking. Try yoga, mindfulness, or other stress reduction techniques. Participate in group activities if you are able. Lifestyle Get enough sleep. Cut  down on or stop using caffeine, tobacco, alcohol, and any other harmful substances. Eat a healthy diet that includes plenty of vegetables, fruits, whole grains, low-fat dairy products, and lean protein. Limit foods that are high in solid fats, added sugar, or salt (sodium). General instructions Take over-the-counter and prescription medicines only as told by your health care provider. Keep all follow-up visits. It is important for your health care provider to check on your mood, behavior, and medicines. Your health care provider may need to make changes to your treatment. Where to find support Talking to others  Friends and family members can be sources of support and guidance. Talk to trusted friends or family members about your condition. Explain your symptoms and let them know that you are working with a health care provider to treat your depression. Tell friends and family how they can help. Finances Find mental health providers that fit with your financial situation. Talk with your health care provider if you are worried about access to food, housing, or medicine. Call your insurance company to learn about your co-pays and prescription plan. Where to find more information You can find support in your area from: Anxiety and Depression Association of America (ADAA): adaa.org Mental Health America: mentalhealthamerica.net [employer] on Mental Illness: nami.org Contact a health care provider if: You stop taking your antidepressant medicines, and you have any of these symptoms: Nausea. Headache. Light-headedness. Chills and body aches. Not being able to sleep (insomnia). You or your friends and family think your depression is getting worse. Get help right away if: You have thoughts of hurting yourself or others. Get help right away if you feel like you may hurt yourself or others, or have thoughts about taking your own life. Go to your nearest emergency room or: Call 911. Call  the National Suicide Prevention Lifeline at 3073317758 or 988. This is open 24 hours a day. Text the Crisis Text Line at (740)857-2803. This information is not intended to replace advice given to you by your health care provider. Make sure you discuss any questions you have with your health care provider. Document Revised: 08/30/2021 Document Reviewed: 08/30/2021 Elsevier Patient Education  2024 [employer].

## 2022-10-25 ENCOUNTER — Ambulatory Visit (INDEPENDENT_AMBULATORY_CARE_PROVIDER_SITE_OTHER): Payer: Medicare Other | Admitting: Nurse Practitioner

## 2022-10-25 ENCOUNTER — Encounter: Payer: Self-pay | Admitting: Nurse Practitioner

## 2022-10-25 VITALS — BP 114/73 | HR 83 | Temp 98.5°F | Ht 60.0 in | Wt 169.0 lb

## 2022-10-25 DIAGNOSIS — R5383 Other fatigue: Secondary | ICD-10-CM | POA: Diagnosis not present

## 2022-10-25 DIAGNOSIS — M545 Low back pain, unspecified: Secondary | ICD-10-CM | POA: Diagnosis not present

## 2022-10-25 DIAGNOSIS — G8929 Other chronic pain: Secondary | ICD-10-CM | POA: Diagnosis not present

## 2022-10-25 MED ORDER — HYDROCODONE-ACETAMINOPHEN 5-325 MG PO TABS: 1.0000 | ORAL_TABLET | Freq: Four times a day (QID) | ORAL | 0 refills | Status: AC | PRN

## 2022-10-25 NOTE — Progress Notes (Signed)
BP 114/73   Pulse 83   Temp 98.5 F (36.9 C) (Oral)   Ht 5' (1.524 m)   Wt 169 lb (76.7 kg)   LMP  (LMP Unknown)   SpO2 95%   BMI 33.01 kg/m    Subjective:    Patient ID: Priscilla [name], female    DOB: 1950/12/31, 72 y.o.   MRN: [id number]  HPI: Priscilla [name] is a 72 y.o. female  Chief Complaint  Patient presents with   Fatigue   Back Pain   Weight Gain   FATIGUE Reports fatigue ongoing for some time with weight gain of 24 lbs since last year in June 2023 + ongoing back pain -- not able to exercise as often or be as active due to pain.  Back pain exacerbates fatigue and weight. Duration:  months Severity: moderate  Onset: gradual Context when symptoms started:   with back pain Symptoms improve with rest: yes  Depressive symptoms: yes Stress/anxiety: yes Insomnia: no Snoring: no Observed apnea by bed partner: no Daytime hypersomnolence:no Wakes feeling refreshed: no History of sleep study: no Dysnea on exertion:  no Orthopnea/PND: no Chest pain: no Chronic cough: no Lower extremity edema: no Arthralgias:yes Myalgias: no Weakness: no Rash: no     10/25/2022    3:14 PM 05/09/2022    4:07 PM 09/21/2021    8:19 AM 03/29/2021    8:19 AM 02/03/2021    5:54 PM  Depression screen PHQ 2/9  Decreased Interest 0 1 0 3 0  Down, Depressed, Hopeless 1 0 0 3 0  PHQ - 2 Score 1 1 0 6 0  Altered sleeping 3 0 0 0   Tired, decreased energy 3 2 2 3    Change in appetite 1 1 2 1    Feeling bad or failure about yourself  0 0 1    Trouble concentrating 0 0 0 0   Moving slowly or fidgety/restless 0 0 0 0   Suicidal thoughts 0 0 0 0   PHQ-9 Score 8 4 5 10    Difficult doing work/chores Very difficult Not difficult at all  Somewhat difficult        10/25/2022    3:14 PM 05/09/2022    4:07 PM 09/21/2021    8:19 AM 03/29/2021    8:21 AM  GAD 7 : Generalized Anxiety Score  Nervous, Anxious, on Edge 0 0 0 0  Control/stop worrying 0 0 0 0  Worry too much - different things 0 1 0 0   Trouble relaxing 0 0 0 0  Restless 0 0 1 0  Easily annoyed or irritable 1 1 1 3   Afraid - awful might happen 0 0 0 0  Total GAD 7 Score 1 2 2 3   Anxiety Difficulty Somewhat difficult Not difficult at all  Somewhat difficult   BACK PAIN Ongoing issue since having to wear a boot in July 2022, with imaging last on 04/13/21.  No acute findings on imaging.  Getting up and moving causes back pain.  Has had lengthy work-up with ANA and CRP, ESR all reassuring. Duration: months Mechanism of injury: unknown Location: midline and low back Onset: gradual Severity: 0/10 if sitting, 8/10 if moving Quality: dull, aching, pressure-like, and throbbing Frequency: intermittent Radiation: none Aggravating factors: lifting, movement, walking, and bending Alleviating factors: nothing Status: fluctuating Treatments attempted: chiropractor, Lidocaine patches, Tylenol, Ibuprofen, pool exercises,  muscle relaxer Relief with NSAIDs?: no Nighttime pain:  no Paresthesias / decreased sensation:  no Bowel /  bladder incontinence:  no Fevers:  no Dysuria / urinary frequency:  no   Relevant past medical, surgical, family and social history reviewed and updated as indicated. Interim medical history since our last visit reviewed. Allergies and medications reviewed and updated.  Review of Systems  Constitutional:  Positive for fatigue. Negative for activity change, appetite change, diaphoresis and fever.  Respiratory:  Negative for cough, chest tightness, shortness of breath and wheezing.   Cardiovascular:  Negative for chest pain, palpitations and leg swelling.  Gastrointestinal: Negative.   Endocrine: Negative.   Genitourinary: Negative.   Musculoskeletal:  Positive for back pain.  Neurological: Negative.   Psychiatric/Behavioral: Negative.     Per HPI unless specifically indicated above     Objective:    BP 114/73   Pulse 83   Temp 98.5 F (36.9 C) (Oral)   Ht 5' (1.524 m)   Wt 169 lb (76.7 kg)    LMP  (LMP Unknown)   SpO2 95%   BMI 33.01 kg/m   Wt Readings from Last 3 Encounters:  10/25/22 169 lb (76.7 kg)  05/09/22 164 lb (74.4 kg)  10/12/21 145 lb (65.8 kg)    Physical Exam Vitals and nursing note reviewed.  Constitutional:      General: She is awake. She is not in acute distress.    Appearance: She is well-developed and well-groomed. She is not ill-appearing or toxic-appearing.  HENT:     Head: Normocephalic.     Right Ear: Hearing and external ear normal.     Left Ear: Hearing and external ear normal.  Eyes:     General: Lids are normal.        Right eye: No discharge.        Left eye: No discharge.     Conjunctiva/sclera: Conjunctivae normal.     Pupils: Pupils are equal, round, and reactive to light.  Neck:     Thyroid: No thyromegaly.     Vascular: No carotid bruit.  Cardiovascular:     Rate and Rhythm: Normal rate and regular rhythm.     Heart sounds: Normal heart sounds. No murmur heard.    No gallop.  Pulmonary:     Effort: Pulmonary effort is normal. No accessory muscle usage or respiratory distress.     Breath sounds: Normal breath sounds.  Abdominal:     General: Bowel sounds are normal.     Palpations: Abdomen is soft. There is no hepatomegaly or splenomegaly.  Musculoskeletal:     Cervical back: Normal range of motion and neck supple.     Lumbar back: Tenderness present. No swelling or spasms. Normal range of motion. Negative right straight leg raise test and negative left straight leg raise test. Scoliosis (mild upper thoracic) present.     Right lower leg: No edema.     Left lower leg: No edema.     Comments: Mild kyphosis noted R>L.  Lymphadenopathy:     Cervical: No cervical adenopathy.  Skin:    General: Skin is warm and dry.  Neurological:     Mental Status: She is alert and oriented to person, place, and time.     Deep Tendon Reflexes: Reflexes are normal and symmetric.     Reflex Scores:      Brachioradialis reflexes are 2+ on the  right side and 2+ on the left side.      Patellar reflexes are 2+ on the right side and 2+ on the left side. Psychiatric:  Attention and Perception: Attention normal.        Mood and Affect: Mood normal.        Speech: Speech normal.        Behavior: Behavior normal. Behavior is cooperative.        Thought Content: Thought content normal.     Results for orders placed or performed in visit on 09/21/21  CBC with Differential/Platelet  Result Value Ref Range   WBC 5.6 3.4 - 10.8 x10E3/uL   RBC 4.44 4.14 - 5.80 x10E6/uL   Hemoglobin 14.9 13.0 - 17.7 g/dL   Hematocrit 96.0 45.4 - 51.0 %   MCV 96 79 - 97 fL   MCH 33.6 (H) 26.6 - 33.0 pg   MCHC 34.8 31.5 - 35.7 g/dL   RDW 09.8 11.9 - 14.7 %   Platelets 234 150 - 450 x10E3/uL   Neutrophils 54 Not Estab. %   Lymphs 32 Not Estab. %   Monocytes 9 Not Estab. %   Eos 4 Not Estab. %   Basos 1 Not Estab. %   Neutrophils Absolute 3.0 1.4 - 7.0 x10E3/uL   Lymphocytes Absolute 1.8 0.7 - 3.1 x10E3/uL   Monocytes Absolute 0.5 0.1 - 0.9 x10E3/uL   EOS (ABSOLUTE) 0.2 0.0 - 0.4 x10E3/uL   Basophils Absolute 0.1 0.0 - 0.2 x10E3/uL   Immature Granulocytes 0 Not Estab. %   Immature Grans (Abs) 0.0 0.0 - 0.1 x10E3/uL  Comprehensive metabolic panel  Result Value Ref Range   Glucose 88 70 - 99 mg/dL   BUN 12 8 - 27 mg/dL   Creatinine, Ser 8.29 0.76 - 1.27 mg/dL   eGFR 83 >56 OZ/HYQ/6.57   BUN/Creatinine Ratio 12 10 - 24   Sodium 137 134 - 144 mmol/L   Potassium 4.6 3.5 - 5.2 mmol/L   Chloride 102 96 - 106 mmol/L   CO2 24 20 - 29 mmol/L   Calcium 10.2 8.6 - 10.2 mg/dL   Total Protein 6.8 6.0 - 8.5 g/dL   Albumin 4.2 3.8 - 4.8 g/dL   Globulin, Total 2.6 1.5 - 4.5 g/dL   Albumin/Globulin Ratio 1.6 1.2 - 2.2   Bilirubin Total 0.3 0.0 - 1.2 mg/dL   Alkaline Phosphatase 81 44 - 121 IU/L   AST 11 0 - 40 IU/L   ALT 11 0 - 44 IU/L  TSH  Result Value Ref Range   TSH 4.280 0.450 - 4.500 uIU/mL      Assessment & Plan:   Problem List Items  Addressed This Visit       Other   Fatigue    Ongoing since back injury with weight gain, related to reduction in activity due to pain.  Refer to back pain plan of care.  All past labs were reassuring.      Low back pain - Primary    Ongoing since having to wear boot in July 2022.  No red flags.  She has fatigue and weight gain with this as unable to be as active as was in past, this frustrates her.  We will get an MRI lumbar spine, as past XR imaging was stable but pain still ongoing.  Lab work-up with ANA, CRP, ESR was stable past check.  Recommend PT, which she is interested in trying.  Referral for PT placed.  Send in short burst of Norco for pain and continue OTC medications as needed.  Discussed with her options if ongoing pain -- such as physiatry and injections.  Will determine next  steps after MRI returns.      Relevant Medications   HYDROcodone-acetaminophen (NORCO) 5-325 MG tablet   Other Relevant Orders   MR Lumbar Spine Wo Contrast   Ambulatory referral to Physical Therapy     Follow up plan: Return in about 3 months (around 01/15/2023) for Cancel July visit -- reschedule to September for follow-up.

## 2022-10-25 NOTE — Assessment & Plan Note (Signed)
Ongoing since having to wear boot in July 2022.  No red flags.  She has fatigue and weight gain with this as unable to be as active as was in past, this frustrates her.  We will get an MRI lumbar spine, as past XR imaging was stable but pain still ongoing.  Lab work-up with ANA, CRP, ESR was stable past check.  Recommend PT, which she is interested in trying.  Referral for PT placed.  Send in short burst of Norco for pain and continue OTC medications as needed.  Discussed with her options if ongoing pain -- such as physiatry and injections.  Will determine next steps after MRI returns.

## 2022-10-25 NOTE — Assessment & Plan Note (Addendum)
Ongoing since back injury with weight gain, related to reduction in activity due to pain.  Refer to back pain plan of care.  All past labs were reassuring.

## 2022-11-07 ENCOUNTER — Ambulatory Visit: Payer: Medicare Other | Admitting: Nurse Practitioner

## 2022-11-07 ENCOUNTER — Ambulatory Visit: Payer: Medicare Other

## 2022-11-11 ENCOUNTER — Ambulatory Visit
Admission: RE | Admit: 2022-11-11 | Discharge: 2022-11-11 | Disposition: A | Discharge status: Home / Self Care | Payer: Medicare Other | Source: Ambulatory Visit | Attending: Nurse Practitioner | Admitting: Nurse Practitioner

## 2022-11-11 DIAGNOSIS — M48061 Spinal stenosis, lumbar region without neurogenic claudication: Secondary | ICD-10-CM | POA: Diagnosis not present

## 2022-11-11 DIAGNOSIS — G8929 Other chronic pain: Secondary | ICD-10-CM | POA: Insufficient documentation

## 2022-11-11 DIAGNOSIS — M545 Low back pain, unspecified: Secondary | ICD-10-CM | POA: Diagnosis not present

## 2022-11-11 DIAGNOSIS — M47816 Spondylosis without myelopathy or radiculopathy, lumbar region: Secondary | ICD-10-CM | POA: Diagnosis not present

## 2022-11-11 DIAGNOSIS — M5126 Other intervertebral disc displacement, lumbar region: Secondary | ICD-10-CM | POA: Diagnosis not present

## 2022-11-11 DIAGNOSIS — M5136 Other intervertebral disc degeneration, lumbar region: Secondary | ICD-10-CM | POA: Diagnosis not present

## 2022-11-15 ENCOUNTER — Encounter: Payer: Self-pay | Admitting: Nurse Practitioner

## 2022-11-20 NOTE — Progress Notes (Signed)
Contacted via MyChart   Good afternoon Priscilla [name], your imaging has returned.  It does show some degenerative disc (arthritis) as we discussed.  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  We have a couple options.  We could send you to neurosurgery to see if any surgical options recommended for this OR we could send you to physiatry to discuss pain options (often injections which can be beneficial).  Have you started physical therapy?  This would offer benefit as well.  Any questions? Keep being amazing!!  Thank you for allowing me to participate in your care.  I appreciate you. Kindest regards, Mecca Guitron

## 2022-11-22 ENCOUNTER — Encounter: Payer: Self-pay | Admitting: Nurse Practitioner

## 2022-12-21 ENCOUNTER — Encounter: Payer: Self-pay | Admitting: Nurse Practitioner

## 2022-12-21 DIAGNOSIS — M545 Low back pain, unspecified: Secondary | ICD-10-CM

## 2022-12-26 ENCOUNTER — Other Ambulatory Visit: Payer: Self-pay | Admitting: Nurse Practitioner

## 2022-12-27 ENCOUNTER — Encounter: Payer: Self-pay | Admitting: Nurse Practitioner

## 2022-12-27 MED ORDER — AMITRIPTYLINE HCL 25 MG PO TABS: ORAL_TABLET | ORAL | 4 refills | Status: DC

## 2022-12-27 NOTE — Telephone Encounter (Signed)
Refilled 12/18/22 # 180 with 4 refills. Requested Prescriptions  Refused Prescriptions Disp Refills   amitriptyline (ELAVIL) 25 MG tablet [Pharmacy Med Name: AMITRIPTYLINE HCL 25 MG TAB] 180 tablet 0    Sig: Take 2 tablets (50 mg total) by mouth at bedtime.     Psychiatry:  Antidepressants - Heterocyclics (TCAs) Passed - 12/26/2022 10:48 AM      Passed - Completed PHQ-2 or PHQ-9 in the last 360 days      Passed - Valid encounter within last 6 months    Recent Outpatient Visits           2 months ago Chronic midline low back pain without sciatica   [place] [facility] [place], Corrie Dandy T, NP   7 months ago Recurrent major depressive disorder, in partial remission (HCC)   [address] [facility] [place], Corrie Dandy T, NP   1 year ago Irritable bowel syndrome with constipation   [place] [facility] [place], Corrie Dandy T, NP   1 year ago Chronic midline low back pain without sciatica   [facility] [place], Corrie Dandy T, NP   1 year ago Recurrent major depressive disorder, in partial remission (HCC)   [place] [facility] [place], Dorie Rank, NP       Future Appointments             In 1 month [place], Dorie Rank, NP [facility]

## 2022-12-28 ENCOUNTER — Encounter: Payer: Self-pay | Admitting: Nurse Practitioner

## 2023-01-01 NOTE — Telephone Encounter (Signed)
No further action needed.

## 2023-01-25 ENCOUNTER — Ambulatory Visit: Payer: Medicare Other | Admitting: Nurse Practitioner

## 2023-01-27 NOTE — Patient Instructions (Signed)
Be Involved in Caring For Your Health:  Taking Medications When medications are taken as directed, they can greatly improve your health. But if they are not taken as prescribed, they may not work. In some cases, not taking them correctly can be harmful. To help ensure your treatment remains effective and safe, understand your medications and how to take them. Bring your medications to each visit for review by your provider.  Your lab results, notes, and after visit summary will be available on My Chart. We strongly encourage you to use this feature. If lab results are abnormal the clinic will contact you with the appropriate steps. If the clinic does not contact you assume the results are satisfactory. You can always view your results on My Chart. If you have questions regarding your health or results, please contact the clinic during office hours. You can also ask questions on My Chart.  We at [facility] are grateful that you chose [place] to provide your care. We strive to provide evidence-based and compassionate care and are always looking for feedback. If you get a survey from the clinic please complete this so we can hear your opinions.  Preventing High Cholesterol Cholesterol is a white, waxy substance similar to fat that the human body needs to help build cells. The liver makes all the cholesterol that a person's body needs. Having high cholesterol (hypercholesterolemia) increases your risk for heart disease and stroke. Extra or excess cholesterol comes from the food that you eat. High cholesterol can often be prevented with diet and lifestyle changes. If you already have high cholesterol, you can control it with diet, lifestyle changes, and medicines. How can high cholesterol affect me? If you have high cholesterol, fatty deposits (plaques) may build up on the walls of your blood vessels. The blood vessels that carry blood away from your heart are called arteries. Plaques make the  arteries narrower and stiffer. This in turn can: Restrict or block blood flow and cause blood clots to form. Increase your risk for heart attack and stroke. What can increase my risk for high cholesterol? This condition is more likely to develop in people who: Eat foods that are high in saturated fat or cholesterol. Saturated fat is mostly found in foods that come from animal sources. Are overweight. Are not getting enough exercise. Use products that contain nicotine or tobacco, such as cigarettes, e-cigarettes, and chewing tobacco. Have a family history of high cholesterol (familial hypercholesterolemia). What actions can I take to prevent this? Nutrition  Eat less saturated fat. Avoid trans fats (partially hydrogenated oils). These are often found in margarine and in some baked goods, fried foods, and snacks bought in packages. Avoid precooked or cured meat, such as bacon, sausages, or meat loaves. Avoid foods and drinks that have added sugars. Eat more fruits, vegetables, and whole grains. Choose healthy sources of protein, such as fish, poultry, lean cuts of red meat, beans, peas, lentils, and nuts. Choose healthy sources of fat, such as: Nuts. Vegetable oils, especially olive oil. Fish that have healthy fats, such as omega-3 fatty acids. These fish include mackerel or salmon. Lifestyle Lose weight if you are overweight. Maintaining a healthy body mass index (BMI) can help prevent or control high cholesterol. It can also lower your risk for diabetes and high blood pressure. Ask your health care provider to help you with a diet and exercise plan to lose weight safely. Do not use any products that contain nicotine or tobacco. These products include cigarettes,  chewing tobacco, and vaping devices, such as e-cigarettes. If you need help quitting, ask your health care provider. Alcohol use Do not drink alcohol if: Your health care provider tells you not to drink. You are pregnant, may be  pregnant, or are planning to become pregnant. If you drink alcohol: Limit how much you have to: 0-1 drink a day for women. 0-2 drinks a day for men. Know how much alcohol is in your drink. In the [place] one drink equals one 12 oz bottle of beer (355 mL), one 5 oz glass of wine (148 mL), or one 1 oz glass of hard liquor (44 mL). Activity  Get enough exercise. Do exercises as told by your health care provider. Each week, do at least 150 minutes of exercise that takes a medium level of effort (moderate-intensity exercise). This kind of exercise: Makes your heart beat faster while allowing you to still be able to talk. Can be done in short sessions several times a day or longer sessions a few times a week. For example, on 5 days each week, you could walk fast or ride your bike 3 times a day for 10 minutes each time. Medicines Your health care provider may recommend medicines to help lower cholesterol. This may be a medicine to lower the amount of cholesterol that your liver makes. You may need medicine if: Diet and lifestyle changes have not lowered your cholesterol enough. You have high cholesterol and other risk factors for heart disease or stroke. Take over-the-counter and prescription medicines only as told by your health care provider. General information Manage your risk factors for high cholesterol. Talk with your health care provider about all your risk factors and how to lower your risk. Manage other conditions that you have, such as diabetes or high blood pressure (hypertension). Have blood tests to check your cholesterol levels at regular points in time as told by your health care provider. Keep all follow-up visits. This is important. Where to find more information American Heart Association: www.heart.org National Heart, Lung, and Blood Institute: [employer] Summary High cholesterol increases your risk for heart disease and stroke. By keeping your cholesterol level low, you  can reduce your risk for these conditions. High cholesterol can often be prevented with diet and lifestyle changes. Work with your health care provider to manage your risk factors, and have your blood tested regularly. This information is not intended to replace advice given to you by your health care provider. Make sure you discuss any questions you have with your health care provider. Document Revised: 11/25/2021 Document Reviewed: 06/28/2020 Elsevier Patient Education  2024 [employer].

## 2023-01-30 ENCOUNTER — Ambulatory Visit (INDEPENDENT_AMBULATORY_CARE_PROVIDER_SITE_OTHER): Payer: Medicare Other | Admitting: Nurse Practitioner

## 2023-01-30 ENCOUNTER — Encounter: Payer: Self-pay | Admitting: Nurse Practitioner

## 2023-01-30 VITALS — BP 126/76 | HR 88 | Temp 98.5°F | Ht 60.0 in | Wt 170.2 lb

## 2023-01-30 DIAGNOSIS — F32A Depression, unspecified: Secondary | ICD-10-CM

## 2023-01-30 DIAGNOSIS — F17210 Nicotine dependence, cigarettes, uncomplicated: Secondary | ICD-10-CM

## 2023-01-30 DIAGNOSIS — M545 Low back pain, unspecified: Secondary | ICD-10-CM

## 2023-01-30 DIAGNOSIS — E7800 Pure hypercholesterolemia, unspecified: Secondary | ICD-10-CM

## 2023-01-30 DIAGNOSIS — Z780 Asymptomatic menopausal state: Secondary | ICD-10-CM

## 2023-01-30 DIAGNOSIS — E6609 Other obesity due to excess calories: Secondary | ICD-10-CM | POA: Diagnosis not present

## 2023-01-30 DIAGNOSIS — E559 Vitamin D deficiency, unspecified: Secondary | ICD-10-CM

## 2023-01-30 DIAGNOSIS — K581 Irritable bowel syndrome with constipation: Secondary | ICD-10-CM

## 2023-01-30 DIAGNOSIS — I700 Atherosclerosis of aorta: Secondary | ICD-10-CM

## 2023-01-30 DIAGNOSIS — Z0000 Encounter for general adult medical examination without abnormal findings: Secondary | ICD-10-CM | POA: Diagnosis not present

## 2023-01-30 DIAGNOSIS — Z6833 Body mass index (BMI) 33.0-33.9, adult: Secondary | ICD-10-CM

## 2023-01-30 DIAGNOSIS — F3341 Major depressive disorder, recurrent, in partial remission: Secondary | ICD-10-CM

## 2023-01-30 DIAGNOSIS — E538 Deficiency of other specified B group vitamins: Secondary | ICD-10-CM

## 2023-01-30 NOTE — Assessment & Plan Note (Signed)
Noted on past labs, is taking oral supplement.  Recheck B12 level today and if remains on lower side consider monthly injections.

## 2023-01-30 NOTE — Assessment & Plan Note (Signed)
Noted on imaging 04/13/21.  Would benefit from statin therapy, refuses at this time.  Recommend complete cessation of smoking.

## 2023-01-30 NOTE — Assessment & Plan Note (Signed)
Chronic, stable on current medication regimen (compounded Progesterone 125MG  daily).  Continue current regimen.  Script to be faxed to pharmacy in [place] as needed.  She is aware of long term risks of hormone use and refuses to taper off.

## 2023-01-30 NOTE — Assessment & Plan Note (Signed)
Chronic, ongoing -- struggles with back pain and aging.  She wishes to continue Amitriptyline at this time, we tried Wellbutrin in past without benefit.  May benefit from SSRI, but she is concerned about weight gain.  Recommend she look into pool aerobics at [facility] which may benefit both mood and arthritis.

## 2023-01-30 NOTE — Assessment & Plan Note (Signed)
Chronic, ongoing, recheck Vit D level today and continue current supplement.  Recommend she obtain DEXA scan.

## 2023-01-30 NOTE — Assessment & Plan Note (Signed)
BMI 33.24, she is very frustrated with weight gain and not being able to be as active.  Discussed with her activities she could try like elliptical, stationary bike, weights that will not strain back, and pool exercises. Offered support and listening.  Recommended eating smaller high protein, low fat meals more frequently and exercising 30 mins a day 5 times a week with a goal of 10-15lb weight loss in the next 3 months. Patient voiced their understanding and motivation to adhere to these recommendations.

## 2023-01-30 NOTE — Assessment & Plan Note (Addendum)
Chronic, ongoing, stable with Linzess.  Will continue this and send in refills as needed.

## 2023-01-30 NOTE — Progress Notes (Addendum)
BP 126/76   Pulse 88   Temp 98.5 F (36.9 C) (Oral)   Ht 5' (1.524 m)   Wt 170 lb 3.2 oz (77.2 kg)   LMP  (LMP Unknown)   SpO2 96%   BMI 33.24 kg/m    Subjective:    Patient ID: Priscilla [name], female    DOB: 09-03-1950, 72 y.o.   MRN: [id number]  HPI: Priscilla [name] is a 72 y.o. female presenting on 01/30/2023 for Medicare Wellness and follow-up visit. Current medical complaints include:none  She currently lives with: husband Menopausal Symptoms: no  The 10-year ASCVD risk score (Arnett DK, et al., 2019) is: 16.8%   Values used to calculate the score:     Age: 20 years     Sex: Female     Is Non-Hispanic African American: No     Diabetic: No     Tobacco smoker: Yes     Systolic Blood Pressure: 126 mmHg     Is BP treated: No     HDL Cholesterol: 50 mg/dL     Total Cholesterol: 231 mg/dL  Continues on Linzess for irritable bowel with benefit.  Currently participating in physical therapy, is down to one day a week for back pain.  Has good days and bad days. Is using Lidocaine patches at home.  Is not happy with the weight gain, due to back pain is unable to be as active.  Uses heating pad.  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 Continues on B12 and Vitamin D at home for lower levels in past.  DEPRESSION Continues on Amitriptyline, has been on this for years and does not wish to change.  Endorses some sadness with aging, 7 was a difficult age change for her.  Continues to smoke cigarettes daily -- smokes about 1/2 PPD -- has smoked many years.   Continues on Progesterone for menopause symptoms. Mood status: stable Satisfied with current treatment?: yes Symptom severity: moderate  Duration of current treatment : chronic Side effects: no Medication compliance: good compliance Psychotherapy/counseling: none Depressed mood: yes with back pain Anxious mood: sometimes Anhedonia: no Significant weight loss  or gain: yes Insomnia: yes hard to fall asleep Fatigue: at times Feelings of worthlessness or guilt: no Impaired concentration/indecisiveness: no Suicidal ideations: no Hopelessness: no Crying spells: no    01/30/2023    8:07 AM 10/25/2022    3:14 PM 05/09/2022    4:07 PM 09/21/2021    8:19 AM 03/29/2021    8:19 AM  Depression screen PHQ 2/9  Decreased Interest 1 0 1 0 3  Down, Depressed, Hopeless 1 1 0 0 3  PHQ - 2 Score 2 1 1  0 6  Altered sleeping 1 3 0 0 0  Tired, decreased energy 1 3 2 2 3   Change in appetite 2 1 1 2 1   Feeling bad or failure about yourself  2 0 0 1   Trouble concentrating 1 0 0 0 0  Moving slowly or fidgety/restless 0 0 0 0 0  Suicidal thoughts 0 0 0 0 0  PHQ-9 Score 9 8 4 5 10   Difficult doing work/chores Very difficult Very difficult Not difficult at all  Somewhat difficult      01/30/2023    8:08 AM 10/25/2022    3:14 PM 05/09/2022    4:07 PM 09/21/2021    8:19 AM  GAD 7 : Generalized Anxiety Score  Nervous, Anxious, on Edge  0 0 0 0  Control/stop worrying 1 0 0 0  Worry too much - different things 1 0 1 0  Trouble relaxing 1 0 0 0  Restless 0 0 0 1  Easily annoyed or irritable 1 1 1 1   Afraid - awful might happen 1 0 0 0  Total GAD 7 Score 5 1 2 2   Anxiety Difficulty Somewhat difficult Somewhat difficult Not difficult at all       09/21/2021    8:19 AM 10/12/2021    9:11 AM 05/09/2022    4:07 PM 10/25/2022    3:14 PM 01/30/2023    8:06 AM  Fall Risk  Falls in the past year? 1  0 0 0  Was there an injury with Fall? 1  0 0 0  Fall Risk Category Calculator 2  0 0 0  Fall Risk Category (Retired) Moderate  Low    (RETIRED) Patient Fall Risk Level Moderate fall risk Moderate fall risk     Patient at Risk for Falls Due to History of fall(s)  No Fall Risks No Fall Risks No Fall Risks  Fall risk Follow up Falls evaluation completed  Falls evaluation completed  Falls evaluation completed    Past Medical History:  Past Medical History:  Diagnosis Date  .  Anxiety   . Depression   . Menopause     Surgical History:  Past Surgical History:  Procedure Laterality Date  . CHOLECYSTECTOMY    . COLONOSCOPY WITH PROPOFOL N/A 10/12/2021   Procedure: COLONOSCOPY WITH PROPOFOL;  Surgeon: Toney Reil, MD;  Location[facility] ENDOSCOPY;  Service: Gastroenterology;  Laterality: N/A;  . ovarian cyst rupture      Medications:  Current Outpatient Medications on File Prior to Visit  Medication Sig  . amitriptyline (ELAVIL) 25 MG tablet Take 2 tablets (50 mg total) by mouth at bedtime.  . cholecalciferol (VITAMIN D) 1000 UNITS tablet Take by mouth. Take 2 capsules once daily  . lidocaine (LIDODERM) 5 % Place 1 patch onto the skin daily. Remove & Discard patch within 12 hours or as directed by MD  . linaclotide (LINZESS) 72 MCG capsule Take 1 capsule (72 mcg total) by mouth daily before breakfast.  . NONFORMULARY OR COMPOUNDED ITEM Take 125 mg by mouth daily. Progesterone from [facility]   No current facility-administered medications on file prior to visit.    Allergies:  No Known Allergies  Social History:  Social History   Socioeconomic History  . Marital status: Married    Spouse name: Not on file  . Number of children: Not on file  . Years of education: Not on file  . Highest education level: Not on file  Occupational History  . Not on file  Tobacco Use  . Smoking status: Every Day    Current packs/day: 0.50    Types: Cigarettes  . Smokeless tobacco: Never  Vaping Use  . Vaping status: Never Used  Substance and Sexual Activity  . Alcohol use: No    Alcohol/week: 0.0 standard drinks of alcohol  . Drug use: No  . Sexual activity: Yes  Other Topics Concern  . Not on file  Social History Narrative  . Not on file   Social Determinants of Health   Financial Resource Strain: Low Risk  (01/30/2023)   Overall Financial Resource Strain (CARDIA)   . Difficulty of Paying Living Expenses: Not very hard  Food Insecurity: No Food  Insecurity (01/30/2023)   Hunger Vital Sign   .  Worried About Programme researcher, broadcasting/film/video in the Last Year: Never true   . Ran Out of Food in the Last Year: Never true  Transportation Needs: No Transportation Needs (01/30/2023)   PRAPARE - Transportation   . Lack of Transportation (Medical): No   . Lack of Transportation (Non-Medical): No  Physical Activity: Insufficiently Active (01/30/2023)   Exercise Vital Sign   . Days of Exercise per Week: 3 days   . Minutes of Exercise per Session: 30 min  Stress: No Stress Concern Present (01/30/2023)   [employer] of Occupational Health - Occupational Stress Questionnaire   . Feeling of Stress : Not at all  Social Connections: Moderately Isolated (01/30/2023)   Social Connection and Isolation Panel [NHANES]   . Frequency of Communication with Friends and Family: More than three times a week   . Frequency of Social Gatherings with Friends and Family: More than three times a week   . Attends Religious Services: Never   . Active Member of Clubs or Organizations: No   . Attends Banker Meetings: Never   . Marital Status: Married  Catering manager Violence: Not At Risk (01/30/2023)   Humiliation, Afraid, Rape, and Kick questionnaire   . Fear of Current or Ex-Partner: No   . Emotionally Abused: No   . Physically Abused: No   . Sexually Abused: No   Social History   Tobacco Use  Smoking Status Every Day  . Current packs/day: 0.50  . Types: Cigarettes  Smokeless Tobacco Never   Social History   Substance and Sexual Activity  Alcohol Use No  . Alcohol/week: 0.0 standard drinks of alcohol    Family History:  Family History  Problem Relation Age of Onset  . Dementia Mother   . Thyroid disease Mother   . Hypertension Mother   . Arthritis Mother   . Diabetes Mother   . Diabetes Father     Past medical history, surgical history, medications, allergies, family history and social history reviewed with patient today and changes  made to appropriate areas of the chart.   ROS All other ROS negative except what is listed above and in the HPI.      Objective:    BP 126/76   Pulse 88   Temp 98.5 F (36.9 C) (Oral)   Ht 5' (1.524 m)   Wt 170 lb 3.2 oz (77.2 kg)   LMP  (LMP Unknown)   SpO2 96%   BMI 33.24 kg/m   Wt Readings from Last 3 Encounters:  01/30/23 170 lb 3.2 oz (77.2 kg)  10/25/22 169 lb (76.7 kg)  05/09/22 164 lb (74.4 kg)    Physical Exam Vitals and nursing note reviewed. Exam conducted with a chaperone present.  Constitutional:      General: She is awake. She is not in acute distress.    Appearance: She is well-developed and well-groomed. She is obese. She is not ill-appearing or toxic-appearing.  HENT:     Head: Normocephalic and atraumatic.     Right Ear: Hearing, tympanic membrane, ear canal and external ear normal. No drainage.     Left Ear: Hearing, tympanic membrane, ear canal and external ear normal. No drainage.     Nose: Nose normal.     Right Sinus: No maxillary sinus tenderness or frontal sinus tenderness.     Left Sinus: No maxillary sinus tenderness or frontal sinus tenderness.     Mouth/Throat:     Mouth: Mucous membranes are moist.  Pharynx: Oropharynx is clear. Uvula midline. No pharyngeal swelling, oropharyngeal exudate or posterior oropharyngeal erythema.  Eyes:     General: Lids are normal.        Right eye: No discharge.        Left eye: No discharge.     Extraocular Movements: Extraocular movements intact.     Conjunctiva/sclera: Conjunctivae normal.     Pupils: Pupils are equal, round, and reactive to light.     Visual Fields: Right eye visual fields normal and left eye visual fields normal.  Neck:     Thyroid: No thyromegaly.     Vascular: No carotid bruit.     Trachea: Trachea normal.  Cardiovascular:     Rate and Rhythm: Normal rate and regular rhythm.     Heart sounds: Normal heart sounds. No murmur heard.    No gallop.  Pulmonary:     Effort:  Pulmonary effort is normal. No accessory muscle usage or respiratory distress.     Breath sounds: Normal breath sounds.  Chest:  Breasts:    Right: Normal.     Left: Normal.  Abdominal:     General: Bowel sounds are normal.     Palpations: Abdomen is soft. There is no hepatomegaly or splenomegaly.     Tenderness: There is no abdominal tenderness.  Musculoskeletal:        General: Normal range of motion.     Cervical back: Normal range of motion and neck supple.     Right lower leg: No edema.     Left lower leg: No edema.  Lymphadenopathy:     Head:     Right side of head: No submental, submandibular, tonsillar, preauricular or posterior auricular adenopathy.     Left side of head: No submental, submandibular, tonsillar, preauricular or posterior auricular adenopathy.     Cervical: No cervical adenopathy.     Upper Body:     Right upper body: No supraclavicular, axillary or pectoral adenopathy.     Left upper body: No supraclavicular, axillary or pectoral adenopathy.  Skin:    General: Skin is warm and dry.     Capillary Refill: Capillary refill takes less than 2 seconds.     Findings: No rash.  Neurological:     Mental Status: She is alert and oriented to person, place, and time.     Gait: Gait is intact.     Deep Tendon Reflexes: Reflexes are normal and symmetric.     Reflex Scores:      Brachioradialis reflexes are 2+ on the right side and 2+ on the left side.      Patellar reflexes are 2+ on the right side and 2+ on the left side. Psychiatric:        Attention and Perception: Attention normal.        Mood and Affect: Mood normal.        Speech: Speech normal.        Behavior: Behavior normal. Behavior is cooperative.        Thought Content: Thought content normal.        Judgment: Judgment normal.      01/30/2023    8:17 AM 02/03/2021    5:59 PM  6CIT Screen  What Year? 0 points 0 points  What month? 0 points 0 points  What time? 0 points 0 points  Count back from  20 0 points 0 points  Months in reverse 0 points 0 points  Repeat phrase 0 points 0  points  Total Score 0 points 0 points   Results for orders placed or performed in visit on 09/21/21  CBC with Differential/Platelet  Result Value Ref Range   WBC 5.6 3.4 - 10.8 x10E3/uL   RBC 4.44 4.14 - 5.80 x10E6/uL   Hemoglobin 14.9 13.0 - 17.7 g/dL   Hematocrit 84.6 96.2 - 51.0 %   MCV 96 79 - 97 fL   MCH 33.6 (H) 26.6 - 33.0 pg   MCHC 34.8 31.5 - 35.7 g/dL   RDW 95.2 84.1 - 32.4 %   Platelets 234 150 - 450 x10E3/uL   Neutrophils 54 Not Estab. %   Lymphs 32 Not Estab. %   Monocytes 9 Not Estab. %   Eos 4 Not Estab. %   Basos 1 Not Estab. %   Neutrophils Absolute 3.0 1.4 - 7.0 x10E3/uL   Lymphocytes Absolute 1.8 0.7 - 3.1 x10E3/uL   Monocytes Absolute 0.5 0.1 - 0.9 x10E3/uL   EOS (ABSOLUTE) 0.2 0.0 - 0.4 x10E3/uL   Basophils Absolute 0.1 0.0 - 0.2 x10E3/uL   Immature Granulocytes 0 Not Estab. %   Immature Grans (Abs) 0.0 0.0 - 0.1 x10E3/uL  Comprehensive metabolic panel  Result Value Ref Range   Glucose 88 70 - 99 mg/dL   BUN 12 8 - 27 mg/dL   Creatinine, Ser 4.01 0.76 - 1.27 mg/dL   eGFR 83 >02 VO/ZDG/6.44   BUN/Creatinine Ratio 12 10 - 24   Sodium 137 134 - 144 mmol/L   Potassium 4.6 3.5 - 5.2 mmol/L   Chloride 102 96 - 106 mmol/L   CO2 24 20 - 29 mmol/L   Calcium 10.2 8.6 - 10.2 mg/dL   Total Protein 6.8 6.0 - 8.5 g/dL   Albumin 4.2 3.8 - 4.8 g/dL   Globulin, Total 2.6 1.5 - 4.5 g/dL   Albumin/Globulin Ratio 1.6 1.2 - 2.2   Bilirubin Total 0.3 0.0 - 1.2 mg/dL   Alkaline Phosphatase 81 44 - 121 IU/L   AST 11 0 - 40 IU/L   ALT 11 0 - 44 IU/L  TSH  Result Value Ref Range   TSH 4.280 0.450 - 4.500 uIU/mL      Assessment & Plan:   Problem List Items Addressed This Visit       Cardiovascular and Mediastinum   Aortic atherosclerosis (HCC)    Noted on imaging 04/13/21.  Would benefit from statin therapy, refuses at this time.  Recommend complete cessation of smoking.       Relevant Orders   Comprehensive metabolic panel   Lipid Panel w/o Chol/HDL Ratio     Digestive   IBS (irritable bowel syndrome)    Chronic, ongoing, stable with Linzess.  Will continue this and send in refills as needed.        Other   B12 deficiency    Noted on past labs, is taking oral supplement.  Recheck B12 level today and if remains on lower side consider monthly injections.      Relevant Orders   Vitamin B12   CBC with Differential/Platelet   Depression    Chronic, ongoing -- struggles with back pain and aging.  She wishes to continue Amitriptyline at this time, we tried Wellbutrin in past without benefit.  May benefit from SSRI, but she is concerned about weight gain.  Recommend she look into pool aerobics at [facility] which may benefit both mood and arthritis.      Relevant Orders   TSH   Elevated LDL cholesterol  level    Chronic with ASCVD 16.8%, refuses statin medication.  Lipid panel today and continue to discuss.      Relevant Orders   Comprehensive metabolic panel   Lipid Panel w/o Chol/HDL Ratio   Low back pain    Ongoing since having to wear boot in July 2022.  No red flags.  Continue PT at this time which offers some benefit.  Discussed with her options if ongoing pain -- such as physiatry and injections -- she will think about these.  Suspect it may offer her some benefit and allow her to be more active, which she enjoys.      Menopause    Chronic, stable on current medication regimen (compounded Progesterone 125MG  daily).  Continue current regimen.  Script to be faxed to pharmacy in [place] as needed.  She is aware of long term risks of hormone use and refuses to taper off.      Nicotine dependence, cigarettes, uncomplicated    I have recommended complete cessation of tobacco use. I have discussed various options available for assistance with tobacco cessation including over the counter methods (Nicotine gum, patch and lozenges). We also discussed prescription  options (Chantix, Nicotine Inhaler / Nasal Spray). The patient is not interested in pursuing any prescription tobacco cessation options at this time.  Refuses lung cancer screening.       Obesity    BMI 33.24, she is very frustrated with weight gain and not being able to be as active.  Discussed with her activities she could try like elliptical, stationary bike, weights that will not strain back, and pool exercises. Offered support and listening.  Recommended eating smaller high protein, low fat meals more frequently and exercising 30 mins a day 5 times a week with a goal of 10-15lb weight loss in the next 3 months. Patient voiced their understanding and motivation to adhere to these recommendations.       Vitamin D deficiency    Chronic, ongoing, recheck Vit D level today and continue current supplement.  Recommend she obtain DEXA scan.      Relevant Orders   VITAMIN D 25 Hydroxy (Vit-D Deficiency, Fractures)   Other Visit Diagnoses     Medicare annual wellness visit, subsequent    -  Primary   Medicare wellness due and performed with patient today. Refuses vaccines and majority of health maintenance recommendations.        Follow up plan: Return in about 6 months (around 07/30/2023) for BACK PAIN AND MOOD.   LABORATORY TESTING:  - Pap smear: not applicable  IMMUNIZATIONS:   - Tdap: Tetanus vaccination status reviewed: refused. - Influenza: Refused - Pneumovax: Refused - Prevnar: Refused - COVID: Refused - HPV: Not applicable - Shingrix vaccine: Refused  SCREENING: -Mammogram: Refused  - Colonoscopy: Refused  - Bone Density: Refused  -Hearing Test: Not applicable  -Spirometry: Refused   PATIENT COUNSELING:   Advised to take 1 mg of folate supplement per day if capable of pregnancy.   Sexuality: Discussed sexually transmitted diseases, partner selection, use of condoms, avoidance of unintended pregnancy  and contraceptive alternatives.   Advised to avoid cigarette  smoking.  I discussed with the patient that most people either abstain from alcohol or drink within safe limits (<=14/week and <=4 drinks/occasion for males, <=7/weeks and <= 3 drinks/occasion for females) and that the risk for alcohol disorders and other health effects rises proportionally with the number of drinks per week and how often a drinker exceeds daily limits.  Discussed cessation/primary prevention of drug use and availability of treatment for abuse.   Diet: Encouraged to adjust caloric intake to maintain  or achieve ideal body weight, to reduce intake of dietary saturated fat and total fat, to limit sodium intake by avoiding high sodium foods and not adding table salt, and to maintain adequate dietary potassium and calcium preferably from fresh fruits, vegetables, and low-fat dairy products.    Stressed the importance of regular exercise  Injury prevention: Discussed safety belts, safety helmets, smoke detector, smoking near bedding or upholstery.   Dental health: Discussed importance of regular tooth brushing, flossing, and dental visits.    NEXT PREVENTATIVE PHYSICAL DUE IN 1 YEAR. Return in about 6 months (around 07/30/2023) for BACK PAIN AND MOOD.

## 2023-01-30 NOTE — Assessment & Plan Note (Signed)
I have recommended complete cessation of tobacco use. I have discussed various options available for assistance with tobacco cessation including over the counter methods (Nicotine gum, patch and lozenges). We also discussed prescription options (Chantix, Nicotine Inhaler / Nasal Spray). The patient is not interested in pursuing any prescription tobacco cessation options at this time.  Refuses lung cancer screening.

## 2023-01-30 NOTE — Assessment & Plan Note (Signed)
Ongoing since having to wear boot in July 2022.  No red flags.  Continue PT at this time which offers some benefit.  Discussed with her options if ongoing pain -- such as physiatry and injections -- she will think about these.  Suspect it may offer her some benefit and allow her to be more active, which she enjoys.

## 2023-01-30 NOTE — Assessment & Plan Note (Signed)
Chronic with ASCVD 16.8%, refuses statin medication.  Lipid panel today and continue to discuss.

## 2023-01-31 ENCOUNTER — Encounter: Payer: Self-pay | Admitting: Nurse Practitioner

## 2023-01-31 LAB — CBC WITH DIFFERENTIAL/PLATELET
Basophils Absolute: 0.1 10*3/uL (ref 0.0–0.2)
Basos: 1 %
EOS (ABSOLUTE): 0.2 10*3/uL (ref 0.0–0.4)
Eos: 3 %
Hematocrit: 44.6 % (ref 34.0–46.6)
Hemoglobin: 14.8 g/dL (ref 11.1–15.9)
Immature Grans (Abs): 0 10*3/uL (ref 0.0–0.1)
Immature Granulocytes: 0 %
Lymphocytes Absolute: 2.1 10*3/uL (ref 0.7–3.1)
Lymphs: 33 %
MCH: 33 pg (ref 26.6–33.0)
MCHC: 33.2 g/dL (ref 31.5–35.7)
MCV: 99 fL — ABNORMAL HIGH (ref 79–97)
Monocytes Absolute: 0.6 10*3/uL (ref 0.1–0.9)
Monocytes: 9 %
Neutrophils Absolute: 3.5 10*3/uL (ref 1.4–7.0)
Neutrophils: 54 %
Platelets: 214 10*3/uL (ref 150–450)
RBC: 4.49 x10E6/uL (ref 3.77–5.28)
RDW: 12.9 % (ref 11.7–15.4)
WBC: 6.5 10*3/uL (ref 3.4–10.8)

## 2023-01-31 LAB — COMPREHENSIVE METABOLIC PANEL
ALT: 24 IU/L (ref 0–32)
AST: 21 IU/L (ref 0–40)
Albumin: 4.1 g/dL (ref 3.8–4.8)
Alkaline Phosphatase: 69 IU/L (ref 44–121)
BUN/Creatinine Ratio: 12 (ref 12–28)
BUN: 11 mg/dL (ref 8–27)
Bilirubin Total: 0.3 mg/dL (ref 0.0–1.2)
CO2: 26 mmol/L (ref 20–29)
Calcium: 9.9 mg/dL (ref 8.7–10.3)
Chloride: 100 mmol/L (ref 96–106)
Creatinine, Ser: 0.9 mg/dL (ref 0.57–1.00)
Globulin, Total: 2.7 g/dL (ref 1.5–4.5)
Glucose: 101 mg/dL — ABNORMAL HIGH (ref 70–99)
Potassium: 4.7 mmol/L (ref 3.5–5.2)
Sodium: 140 mmol/L (ref 134–144)
Total Protein: 6.8 g/dL (ref 6.0–8.5)
eGFR: 68 mL/min/{1.73_m2} (ref >59)

## 2023-01-31 LAB — LIPID PANEL W/O CHOL/HDL RATIO
Cholesterol, Total: 230 mg/dL — ABNORMAL HIGH (ref 100–199)
HDL: 38 mg/dL — ABNORMAL LOW (ref >39)
LDL Chol Calc (NIH): 146 mg/dL — ABNORMAL HIGH (ref 0–99)
Triglycerides: 249 mg/dL — ABNORMAL HIGH (ref 0–149)
VLDL Cholesterol Cal: 46 mg/dL — ABNORMAL HIGH (ref 5–40)

## 2023-01-31 LAB — VITAMIN B12: Vitamin B-12: 279 pg/mL (ref 232–1245)

## 2023-01-31 LAB — VITAMIN D 25 HYDROXY (VIT D DEFICIENCY, FRACTURES): Vit D, 25-Hydroxy: 45.5 ng/mL (ref 30.0–100.0)

## 2023-01-31 LAB — TSH: TSH: 4.44 u[IU]/mL (ref 0.450–4.500)

## 2023-01-31 NOTE — Progress Notes (Signed)
Contacted via MyChart   Good afternoon Priscilla [name], your labs have returned and overall are stable with exception of cholesterol levels and B12.  Lipid panel levels are still elevated, I do recommend statin therapy for protection, but I know you prefer not to take.  Let me know if you change your mind.:)  B12 level is low, I recommend starting Vitamin B12 1000 MCG daily which is good for nervous system health.  Any questions? Keep being excellent!!  Thank you for allowing me to participate in your care.  I appreciate you. Kindest regards, Phoenix Riesen

## 2023-02-13 ENCOUNTER — Encounter: Payer: Self-pay | Admitting: Nurse Practitioner

## 2023-02-16 ENCOUNTER — Encounter: Payer: Self-pay | Admitting: Nurse Practitioner

## 2023-02-22 MED ORDER — ROSUVASTATIN CALCIUM 10 MG PO TABS: 10.0000 mg | ORAL_TABLET | Freq: Every day | ORAL | 3 refills | Status: DC

## 2023-07-11 ENCOUNTER — Other Ambulatory Visit: Payer: Self-pay | Admitting: Nurse Practitioner

## 2023-07-11 ENCOUNTER — Encounter: Payer: Self-pay | Admitting: Nurse Practitioner

## 2023-07-11 MED ORDER — NONFORMULARY OR COMPOUNDED ITEM: 125.0000 mg | Freq: Every day | 4 refills | Status: AC

## 2023-07-29 NOTE — Patient Instructions (Signed)

## 2023-07-31 ENCOUNTER — Ambulatory Visit: Payer: Medicare Other | Admitting: Nurse Practitioner

## 2023-07-31 ENCOUNTER — Encounter: Payer: Self-pay | Admitting: Nurse Practitioner

## 2023-07-31 VITALS — BP 115/73 | HR 84 | Temp 98.8°F | Wt 168.8 lb

## 2023-07-31 DIAGNOSIS — Z780 Asymptomatic menopausal state: Secondary | ICD-10-CM

## 2023-07-31 DIAGNOSIS — F17210 Nicotine dependence, cigarettes, uncomplicated: Secondary | ICD-10-CM

## 2023-07-31 DIAGNOSIS — Z6833 Body mass index (BMI) 33.0-33.9, adult: Secondary | ICD-10-CM

## 2023-07-31 DIAGNOSIS — E66811 Obesity, class 1: Secondary | ICD-10-CM

## 2023-07-31 DIAGNOSIS — E6609 Other obesity due to excess calories: Secondary | ICD-10-CM

## 2023-07-31 DIAGNOSIS — F3341 Major depressive disorder, recurrent, in partial remission: Secondary | ICD-10-CM

## 2023-07-31 DIAGNOSIS — E782 Mixed hyperlipidemia: Secondary | ICD-10-CM | POA: Diagnosis not present

## 2023-07-31 DIAGNOSIS — I700 Atherosclerosis of aorta: Secondary | ICD-10-CM

## 2023-07-31 NOTE — Assessment & Plan Note (Signed)
 Chronic, stable on current medication regimen (compounded Progesterone 125MG  daily).  Continue current regimen.  Script to be faxed to pharmacy in [place] as needed.  She is aware of long term risks of hormone use and refuses to taper off.  She plans to work on reduction to discontinuation over the next year.

## 2023-07-31 NOTE — Assessment & Plan Note (Signed)
 Chronic with ASCVD 15.7%, Crestor caused severe hip and knee pain, even with first dose.  Lipid panel today and continue to discuss.

## 2023-07-31 NOTE — Assessment & Plan Note (Signed)
 BMI 33.97, she is very frustrated with weight gain, is working on stopping Dr. Reino Kent.  Discussed with her activities she could try like elliptical, stationary bike, weights that will not strain back, and pool exercises. Offered support and listening.  Recommended eating smaller high protein, low fat meals more frequently and exercising 30 mins a day 5 times a week with a goal of 10-15lb weight loss in the next 3 months. Patient voiced their understanding and motivation to adhere to these recommendations.

## 2023-07-31 NOTE — Assessment & Plan Note (Signed)
I have recommended complete cessation of tobacco use. I have discussed various options available for assistance with tobacco cessation including over the counter methods (Nicotine gum, patch and lozenges). We also discussed prescription options (Chantix, Nicotine Inhaler / Nasal Spray). The patient is not interested in pursuing any prescription tobacco cessation options at this time.  Refuses lung cancer screening.

## 2023-07-31 NOTE — Assessment & Plan Note (Signed)
Noted on imaging 04/13/21.  Would benefit from statin therapy, refuses at this time.  Recommend complete cessation of smoking.

## 2023-07-31 NOTE — Assessment & Plan Note (Signed)
 Chronic, ongoing.  She wishes to continue Amitriptyline at this time, we tried Wellbutrin in past without benefit.  May benefit from SSRI, but she is concerned about weight gain.  Recommend she look into pool aerobics at [facility] which may benefit both mood and arthritis.

## 2023-07-31 NOTE — Progress Notes (Signed)
 BP 115/73   Pulse 84   Temp 98.8 F (37.1 C) (Oral)   Wt 168 lb 12.8 oz (76.6 kg)   LMP  (LMP Unknown)   SpO2 100%   BMI 32.97 kg/m    Subjective:    Patient ID: Priscilla [name], female    DOB: Nov 01, 1950, 72 y.o.   MRN: [id number]  HPI: Priscilla [name] is a 73 y.o. female  Chief Complaint  Patient presents with   Anxiety   Depression   Hyperlipidemia   IRRITABLE BOWEL SYNDROME Was taking Linzess 72 MCG, it was too costly. Treatments attempted: Linzess -- this is working well for her Fever: no Nausea: no Vomiting: no Weight loss: no Decreased appetite: no Diarrhea: no Constipation: no Blood in stool: no Heartburn: no Jaundice: no Rash: no  HYPERLIPIDEMIA Tried Crestor but this caused hip and knee pain. History of aortic atherosclerosis on past imaging. Daily smoker 1/2 PPD, has smoked for many years. Satisfied with current treatment?  no Side effects:  yes Past cholesterol meds: Crestor Supplements: none Aspirin:  no The 10-year ASCVD risk score (Arnett DK, et al., 2019) is: 15.7%   Values used to calculate the score:     Age: 65 years     Sex: Female     Is Non-Hispanic African American: No     Diabetic: No     Tobacco smoker: Yes     Systolic Blood Pressure: 115 mmHg     Is BP treated: No     HDL Cholesterol: 38 mg/dL     Total Cholesterol: 230 mg/dL Chest pain:  no Coronary artery disease:  no Family history CAD:  no Family history early CAD:  no   DEPRESSION Continues on Amitriptyline, has been on this regimen for several years.    Continues on Progesterone for menopause symptoms. Mood status: stable Satisfied with current treatment?: yes Symptom severity: moderate  Duration of current treatment : chronic Side effects: no Medication compliance: good compliance Psychotherapy/counseling: none Depressed mood: yes with recent transition Anxious mood: no Anhedonia: no Significant weight loss or gain: yes Insomnia: yes hard to fall  asleep Fatigue: no Feelings of worthlessness or guilt: no Impaired concentration/indecisiveness: no Suicidal ideations: no Hopelessness: no Crying spells: no    07/31/2023    8:35 AM 01/30/2023    8:07 AM 10/25/2022    3:14 PM 05/09/2022    4:07 PM 09/21/2021    8:19 AM  Depression screen PHQ 2/9  Decreased Interest 0 1 0 1 0  Down, Depressed, Hopeless 0 1 1 0 0  PHQ - 2 Score 0 2 1 1  0  Altered sleeping 0 1 3 0 0  Tired, decreased energy 0 1 3 2 2   Change in appetite 3 2 1 1 2   Feeling bad or failure about yourself  0 2 0 0 1  Trouble concentrating 0 1 0 0 0  Moving slowly or fidgety/restless 0 0 0 0 0  Suicidal thoughts 0 0 0 0 0  PHQ-9 Score 3 9 8 4 5   Difficult doing work/chores Not difficult at all Very difficult Very difficult Not difficult at all        07/31/2023    8:35 AM 01/30/2023    8:08 AM 10/25/2022    3:14 PM 05/09/2022    4:07 PM  GAD 7 : Generalized Anxiety Score  Nervous, Anxious, on Edge 0 0 0 0  Control/stop worrying 0 1 0 0  Worry too much - different  things 0 1 0 1  Trouble relaxing 0 1 0 0  Restless 0 0 0 0  Easily annoyed or irritable 0 1 1 1   Afraid - awful might happen 0 1 0 0  Total GAD 7 Score 0 5 1 2   Anxiety Difficulty Not difficult at all Somewhat difficult Somewhat difficult Not difficult at all   Relevant past medical, surgical, family and social history reviewed and updated as indicated. Interim medical history since our last visit reviewed. Allergies and medications reviewed and updated.  Review of Systems  Constitutional:  Negative for activity change, appetite change, diaphoresis, fatigue and fever.  Respiratory:  Negative for cough, chest tightness and shortness of breath.   Cardiovascular:  Negative for chest pain, palpitations and leg swelling.  Gastrointestinal: Negative.   Neurological: Negative.   Psychiatric/Behavioral: Negative.     Per HPI unless specifically indicated above     Objective:    BP 115/73   Pulse 84    Temp 98.8 F (37.1 C) (Oral)   Wt 168 lb 12.8 oz (76.6 kg)   LMP  (LMP Unknown)   SpO2 100%   BMI 32.97 kg/m   Wt Readings from Last 3 Encounters:  07/31/23 168 lb 12.8 oz (76.6 kg)  01/30/23 170 lb 3.2 oz (77.2 kg)  10/25/22 169 lb (76.7 kg)    Physical Exam Vitals and nursing note reviewed.  Constitutional:      General: She is awake. She is not in acute distress.    Appearance: She is well-developed and well-groomed. She is obese. She is not ill-appearing or toxic-appearing.  HENT:     Head: Normocephalic.     Right Ear: Hearing normal.     Left Ear: Hearing normal.  Eyes:     General: Lids are normal.        Right eye: No discharge.        Left eye: No discharge.     Conjunctiva/sclera: Conjunctivae normal.     Pupils: Pupils are equal, round, and reactive to light.  Neck:     Thyroid: No thyromegaly.     Vascular: No carotid bruit.  Cardiovascular:     Rate and Rhythm: Normal rate and regular rhythm.     Heart sounds: Normal heart sounds. No murmur heard.    No gallop.  Pulmonary:     Effort: Pulmonary effort is normal. No accessory muscle usage or respiratory distress.     Breath sounds: Normal breath sounds.  Abdominal:     General: Bowel sounds are normal. There is no distension.     Palpations: Abdomen is soft.     Tenderness: There is no abdominal tenderness.     Hernia: No hernia is present.  Musculoskeletal:     Cervical back: Normal range of motion and neck supple.     Right lower leg: No edema.     Left lower leg: No edema.  Skin:    General: Skin is warm and dry.  Neurological:     Mental Status: She is alert and oriented to person, place, and time.  Psychiatric:        Attention and Perception: Attention normal.        Mood and Affect: Mood normal.        Speech: Speech normal.        Behavior: Behavior normal. Behavior is cooperative.        Thought Content: Thought content normal.    Results for orders placed or performed in  visit on  01/30/23  Comprehensive metabolic panel   Collection Time: 01/30/23  8:34 AM  Result Value Ref Range   Glucose 101 (H) 70 - 99 mg/dL   BUN 11 8 - 27 mg/dL   Creatinine, Ser 9.56 0.57 - 1.00 mg/dL   eGFR 68 >21 HY/QMV/7.84   BUN/Creatinine Ratio 12 12 - 28   Sodium 140 134 - 144 mmol/L   Potassium 4.7 3.5 - 5.2 mmol/L   Chloride 100 96 - 106 mmol/L   CO2 26 20 - 29 mmol/L   Calcium 9.9 8.7 - 10.3 mg/dL   Total Protein 6.8 6.0 - 8.5 g/dL   Albumin 4.1 3.8 - 4.8 g/dL   Globulin, Total 2.7 1.5 - 4.5 g/dL   Bilirubin Total 0.3 0.0 - 1.2 mg/dL   Alkaline Phosphatase 69 44 - 121 IU/L   AST 21 0 - 40 IU/L   ALT 24 0 - 32 IU/L  Lipid Panel w/o Chol/HDL Ratio   Collection Time: 01/30/23  8:34 AM  Result Value Ref Range   Cholesterol, Total 230 (H) 100 - 199 mg/dL   Triglycerides 696 (H) 0 - 149 mg/dL   HDL 38 (L) >29 mg/dL   VLDL Cholesterol Cal 46 (H) 5 - 40 mg/dL   LDL Chol Calc (NIH) 528 (H) 0 - 99 mg/dL  VITAMIN D 25 Hydroxy (Vit-D Deficiency, Fractures)   Collection Time: 01/30/23  8:34 AM  Result Value Ref Range   Vit D, 25-Hydroxy 45.5 30.0 - 100.0 ng/mL  Vitamin B12   Collection Time: 01/30/23  8:34 AM  Result Value Ref Range   Vitamin B-12 279 232 - 1,245 pg/mL  CBC with Differential/Platelet   Collection Time: 01/30/23  8:34 AM  Result Value Ref Range   WBC 6.5 3.4 - 10.8 x10E3/uL   RBC 4.49 3.77 - 5.28 x10E6/uL   Hemoglobin 14.8 11.1 - 15.9 g/dL   Hematocrit 41.3 24.4 - 46.6 %   MCV 99 (H) 79 - 97 fL   MCH 33.0 26.6 - 33.0 pg   MCHC 33.2 31.5 - 35.7 g/dL   RDW 01.0 27.2 - 53.6 %   Platelets 214 150 - 450 x10E3/uL   Neutrophils 54 Not Estab. %   Lymphs 33 Not Estab. %   Monocytes 9 Not Estab. %   Eos 3 Not Estab. %   Basos 1 Not Estab. %   Neutrophils Absolute 3.5 1.4 - 7.0 x10E3/uL   Lymphocytes Absolute 2.1 0.7 - 3.1 x10E3/uL   Monocytes Absolute 0.6 0.1 - 0.9 x10E3/uL   EOS (ABSOLUTE) 0.2 0.0 - 0.4 x10E3/uL   Basophils Absolute 0.1 0.0 - 0.2 x10E3/uL    Immature Granulocytes 0 Not Estab. %   Immature Grans (Abs) 0.0 0.0 - 0.1 x10E3/uL  TSH   Collection Time: 01/30/23  8:34 AM  Result Value Ref Range   TSH 4.440 0.450 - 4.500 uIU/mL      Assessment & Plan:   Problem List Items Addressed This Visit       Cardiovascular and Mediastinum   Aortic atherosclerosis (HCC)   Noted on imaging 04/13/21.  Would benefit from statin therapy, refuses at this time.  Recommend complete cessation of smoking.        Other   Depression - Primary   Chronic, ongoing.  She wishes to continue Amitriptyline at this time, we tried Wellbutrin in past without benefit.  May benefit from SSRI, but she is concerned about weight gain.  Recommend she look into pool  aerobics at [facility] which may benefit both mood and arthritis.      Relevant Orders   TSH   Menopause   Chronic, stable on current medication regimen (compounded Progesterone 125MG  daily).  Continue current regimen.  Script to be faxed to pharmacy in [place] as needed.  She is aware of long term risks of hormone use and refuses to taper off.  She plans to work on reduction to discontinuation over the next year.      Mixed hyperlipidemia   Chronic with ASCVD 15.7%, Crestor caused severe hip and knee pain, even with first dose.  Lipid panel today and continue to discuss.      Relevant Orders   Comprehensive metabolic panel   Lipid Panel w/o Chol/HDL Ratio   Nicotine dependence, cigarettes, uncomplicated   I have recommended complete cessation of tobacco use. I have discussed various options available for assistance with tobacco cessation including over the counter methods (Nicotine gum, patch and lozenges). We also discussed prescription options (Chantix, Nicotine Inhaler / Nasal Spray). The patient is not interested in pursuing any prescription tobacco cessation options at this time.  Refuses lung cancer screening.       Obesity   BMI 33.97, she is very frustrated with weight gain, is working on  stopping Dr. Reino Kent.  Discussed with her activities she could try like elliptical, stationary bike, weights that will not strain back, and pool exercises. Offered support and listening.  Recommended eating smaller high protein, low fat meals more frequently and exercising 30 mins a day 5 times a week with a goal of 10-15lb weight loss in the next 3 months. Patient voiced their understanding and motivation to adhere to these recommendations.         Follow up plan: Return in about 6 months (around 01/31/2024) for Depression, ANXIETY.

## 2023-08-01 ENCOUNTER — Encounter: Payer: Self-pay | Admitting: Nurse Practitioner

## 2023-08-01 LAB — COMPREHENSIVE METABOLIC PANEL
ALT: 22 IU/L (ref 0–32)
AST: 19 IU/L (ref 0–40)
Albumin: 4.1 g/dL (ref 3.8–4.8)
Alkaline Phosphatase: 80 IU/L (ref 44–121)
BUN/Creatinine Ratio: 14 (ref 12–28)
BUN: 13 mg/dL (ref 8–27)
Bilirubin Total: 0.2 mg/dL (ref 0.0–1.2)
CO2: 24 mmol/L (ref 20–29)
Calcium: 10.1 mg/dL (ref 8.7–10.3)
Chloride: 100 mmol/L (ref 96–106)
Creatinine, Ser: 0.94 mg/dL (ref 0.57–1.00)
Globulin, Total: 2.9 g/dL (ref 1.5–4.5)
Glucose: 91 mg/dL (ref 70–99)
Potassium: 4.7 mmol/L (ref 3.5–5.2)
Sodium: 140 mmol/L (ref 134–144)
Total Protein: 7 g/dL (ref 6.0–8.5)
eGFR: 64 mL/min/{1.73_m2} (ref >59)

## 2023-08-01 LAB — LIPID PANEL W/O CHOL/HDL RATIO
Cholesterol, Total: 158 mg/dL (ref 100–199)
HDL: 39 mg/dL — ABNORMAL LOW (ref >39)
LDL Chol Calc (NIH): 83 mg/dL (ref 0–99)
Triglycerides: 215 mg/dL — ABNORMAL HIGH (ref 0–149)
VLDL Cholesterol Cal: 36 mg/dL (ref 5–40)

## 2023-08-01 LAB — TSH: TSH: 4.41 u[IU]/mL (ref 0.450–4.500)

## 2023-08-01 NOTE — Progress Notes (Signed)
 Contacted via MyChart   Good morning Priscilla [name], your labs have returned: - Lipid panel showed significant improvement with statin on board, but I know you did not tolerate this daily.  I would recommend to try taking it once a week and see how you do, even at once a week this statin can offer benefit. - Kidney function, creatinine and eGFR, remains normal, as is liver function, AST and ALT.  - thyroid level normal.  Any questions? Keep being stellar!!  Thank you for allowing me to participate in your care.  I appreciate you. Kindest regards, Bowe Sidor

## 2023-10-26 ENCOUNTER — Encounter: Payer: Self-pay | Admitting: Nurse Practitioner

## 2023-12-13 ENCOUNTER — Encounter: Payer: Self-pay | Admitting: Nurse Practitioner

## 2023-12-13 DIAGNOSIS — M1712 Unilateral primary osteoarthritis, left knee: Secondary | ICD-10-CM | POA: Diagnosis not present

## 2023-12-13 DIAGNOSIS — M25462 Effusion, left knee: Secondary | ICD-10-CM | POA: Diagnosis not present

## 2024-01-08 DIAGNOSIS — M1712 Unilateral primary osteoarthritis, left knee: Secondary | ICD-10-CM | POA: Diagnosis not present

## 2024-01-27 NOTE — Patient Instructions (Incomplete)
Wegovy or Zepbound  Focus on DASH diet for high blood pressure or Mediterranean diet  Be Involved in Caring For Your Health:  Taking Medications When medications are taken as directed, they can greatly improve your health. But if they are not taken as prescribed, they may not work. In some cases, not taking them correctly can be harmful. To help ensure your treatment remains effective and safe, understand your medications and how to take them. Bring your medications to each visit for review by your provider.  Your lab results, notes, and after visit summary will be available on My Chart. We strongly encourage you to use this feature. If lab results are abnormal the clinic will contact you with the appropriate steps. If the clinic does not contact you assume the results are satisfactory. You can always view your results on My Chart. If you have questions regarding your health or results, please contact the clinic during office hours. You can also ask questions on My Chart.  We at [facility] are grateful that you chose us to provide your care. We strive to provide evidence-based and compassionate care and are always looking for feedback. If you get a survey from the clinic please complete this so we can hear your opinions.  Preventing High Cholesterol Cholesterol is a white, waxy substance similar to fat that the human body needs to help build cells. The liver makes all the cholesterol that a person's body needs. Having high cholesterol (hypercholesterolemia) increases your risk for heart disease and stroke. Extra or excess cholesterol comes from the food that you eat. High cholesterol can often be prevented with diet and lifestyle changes. If you already have high cholesterol, you can control it with diet, lifestyle changes, and medicines. How can high cholesterol affect me? If you have high cholesterol, fatty deposits (plaques) may build up on the walls of your blood vessels. The blood  vessels that carry blood away from your heart are called arteries. Plaques make the arteries narrower and stiffer. This in turn can: Restrict or block blood flow and cause blood clots to form. Increase your risk for heart attack and stroke. What can increase my risk for high cholesterol? This condition is more likely to develop in people who: Eat foods that are high in saturated fat or cholesterol. Saturated fat is mostly found in foods that come from animal sources. Are overweight. Are not getting enough exercise. Use products that contain nicotine or tobacco, such as cigarettes, e-cigarettes, and chewing tobacco. Have a family history of high cholesterol (familial hypercholesterolemia). What actions can I take to prevent this? Nutrition  Eat less saturated fat. Avoid trans fats (partially hydrogenated oils). These are often found in margarine and in some baked goods, fried foods, and snacks bought in packages. Avoid precooked or cured meat, such as bacon, sausages, or meat loaves. Avoid foods and drinks that have added sugars. Eat more fruits, vegetables, and whole grains. Choose healthy sources of protein, such as fish, poultry, lean cuts of red meat, beans, peas, lentils, and nuts. Choose healthy sources of fat, such as: Nuts. Vegetable oils, especially olive oil. Fish that have healthy fats, such as omega-3 fatty acids. These fish include mackerel or salmon. Lifestyle Lose weight if you are overweight. Maintaining a healthy body mass index (BMI) can help prevent or control high cholesterol. It can also lower your risk for diabetes and high blood pressure. Ask your health care provider to help you with a diet and exercise plan to lose   weight safely. Do not use any products that contain nicotine or tobacco. These products include cigarettes, chewing tobacco, and vaping devices, such as e-cigarettes. If you need help quitting, ask your health care provider. Alcohol use Do not drink  alcohol if: Your health care provider tells you not to drink. You are pregnant, may be pregnant, or are planning to become pregnant. If you drink alcohol: Limit how much you have to: 0-1 drink a day for women. 0-2 drinks a day for men. Know how much alcohol is in your drink. In the [place] one drink equals one 12 oz bottle of beer (355 mL), one 5 oz glass of wine (148 mL), or one 1 oz glass of hard liquor (44 mL). Activity  Get enough exercise. Do exercises as told by your health care provider. Each week, do at least 150 minutes of exercise that takes a medium level of effort (moderate-intensity exercise). This kind of exercise: Makes your heart beat faster while allowing you to still be able to talk. Can be done in short sessions several times a day or longer sessions a few times a week. For example, on 5 days each week, you could walk fast or ride your bike 3 times a day for 10 minutes each time. Medicines Your health care provider may recommend medicines to help lower cholesterol. This may be a medicine to lower the amount of cholesterol that your liver makes. You may need medicine if: Diet and lifestyle changes have not lowered your cholesterol enough. You have high cholesterol and other risk factors for heart disease or stroke. Take over-the-counter and prescription medicines only as told by your health care provider. General information Manage your risk factors for high cholesterol. Talk with your health care provider about all your risk factors and how to lower your risk. Manage other conditions that you have, such as diabetes or high blood pressure (hypertension). Have blood tests to check your cholesterol levels at regular points in time as told by your health care provider. Keep all follow-up visits. This is important. Where to find more information American Heart Association: www.heart.org National Heart, Lung, and Blood Institute: www.nhlbi.nih.gov Summary High cholesterol  increases your risk for heart disease and stroke. By keeping your cholesterol level low, you can reduce your risk for these conditions. High cholesterol can often be prevented with diet and lifestyle changes. Work with your health care provider to manage your risk factors, and have your blood tested regularly. This information is not intended to replace advice given to you by your health care provider. Make sure you discuss any questions you have with your health care provider. Document Revised: 11/25/2021 Document Reviewed: 06/28/2020 Elsevier Patient Education  2024 [employer]vier Inc.  

## 2024-01-31 ENCOUNTER — Other Ambulatory Visit: Payer: Self-pay | Admitting: Nurse Practitioner

## 2024-01-31 ENCOUNTER — Ambulatory Visit (INDEPENDENT_AMBULATORY_CARE_PROVIDER_SITE_OTHER): Admitting: Nurse Practitioner

## 2024-01-31 ENCOUNTER — Encounter: Payer: Self-pay | Admitting: Nurse Practitioner

## 2024-01-31 VITALS — BP 124/54 | HR 79 | Temp 98.7°F | Resp 16 | Ht 60.0 in | Wt 168.2 lb

## 2024-01-31 DIAGNOSIS — E6609 Other obesity due to excess calories: Secondary | ICD-10-CM

## 2024-01-31 DIAGNOSIS — Z780 Asymptomatic menopausal state: Secondary | ICD-10-CM | POA: Diagnosis not present

## 2024-01-31 DIAGNOSIS — F17210 Nicotine dependence, cigarettes, uncomplicated: Secondary | ICD-10-CM

## 2024-01-31 DIAGNOSIS — F3341 Major depressive disorder, recurrent, in partial remission: Secondary | ICD-10-CM

## 2024-01-31 DIAGNOSIS — E559 Vitamin D deficiency, unspecified: Secondary | ICD-10-CM | POA: Diagnosis not present

## 2024-01-31 DIAGNOSIS — I700 Atherosclerosis of aorta: Secondary | ICD-10-CM

## 2024-01-31 DIAGNOSIS — E782 Mixed hyperlipidemia: Secondary | ICD-10-CM | POA: Diagnosis not present

## 2024-01-31 DIAGNOSIS — M545 Low back pain, unspecified: Secondary | ICD-10-CM | POA: Diagnosis not present

## 2024-01-31 DIAGNOSIS — Z6833 Body mass index (BMI) 33.0-33.9, adult: Secondary | ICD-10-CM

## 2024-01-31 DIAGNOSIS — E538 Deficiency of other specified B group vitamins: Secondary | ICD-10-CM | POA: Diagnosis not present

## 2024-01-31 DIAGNOSIS — E66811 Obesity, class 1: Secondary | ICD-10-CM

## 2024-01-31 MED ORDER — AMITRIPTYLINE HCL 25 MG PO TABS: ORAL_TABLET | ORAL | 4 refills | Status: AC

## 2024-01-31 MED ORDER — BUPROPION HCL ER (XL) 150 MG PO TB24: 150.0000 mg | ORAL_TABLET | Freq: Every day | ORAL | 1 refills | Status: AC

## 2024-01-31 NOTE — Assessment & Plan Note (Signed)
Chronic, ongoing, recheck Vit D level today and continue current supplement.  Recommend she obtain DEXA scan.

## 2024-01-31 NOTE — Assessment & Plan Note (Signed)
I have recommended complete cessation of tobacco use. I have discussed various options available for assistance with tobacco cessation including over the counter methods (Nicotine gum, patch and lozenges). We also discussed prescription options (Chantix, Nicotine Inhaler / Nasal Spray). The patient is not interested in pursuing any prescription tobacco cessation options at this time.  Refuses lung cancer screening.

## 2024-01-31 NOTE — Assessment & Plan Note (Addendum)
 Chronic with ASCVD 16.6%, Crestor  caused severe hip and knee pain, even with first dose.  Lipid panel today and continue to discuss.

## 2024-01-31 NOTE — Assessment & Plan Note (Signed)
 BMI 32.85, she is very frustrated with weight gain, is working on this.  Discussed with her activities she could try like elliptical, stationary bike, weights that will not strain back, and pool exercises. Offered support and listening.  Recommended eating smaller high protein, low fat meals more frequently and exercising 30 mins a day 5 times a week with a goal of 10-15lb weight loss in the next 3 months. Patient voiced their understanding and motivation to adhere to these recommendations.

## 2024-01-31 NOTE — Assessment & Plan Note (Signed)
Noted on past labs, is taking oral supplement.  Recheck B12 level today and if remains on lower side consider monthly injections.

## 2024-01-31 NOTE — Assessment & Plan Note (Signed)
Noted on imaging 04/13/21.  Would benefit from statin therapy, refuses at this time.  Recommend complete cessation of smoking.

## 2024-01-31 NOTE — Assessment & Plan Note (Signed)
 Chronic, stable on current medication regimen (compounded Progesterone 125MG  daily).  Continue current regimen.  Script to be faxed to pharmacy in [place] as needed.  She is aware of long term risks of hormone use and refuses to taper off.  She plans to work on reduction to discontinuation over the next year.

## 2024-01-31 NOTE — Assessment & Plan Note (Signed)
 Chronic, discussed with her option of seeing neurosurgery to further discuss or physiatry to look into injections.  Educated her on this.  Suspect it would offer her benefit and improved quality of life. Discussed with her would avoid opioids due to her IBS and constipation, this may worsen symptoms. She will alert provider if wishes to pursue an option we discussed.

## 2024-01-31 NOTE — Assessment & Plan Note (Signed)
 Chronic, ongoing.  She wishes to continue Amitriptyline  at this time.  Will trial Wellbutrin  again, may benefit her anhedonia.  Would avoid Duloxetine, although may help pain, concern for weight gain with this.  May benefit from SSRI, but she is concerned about weight gain.  Recommend she look into pool aerobics at [facility] which may benefit both mood and arthritis.

## 2024-01-31 NOTE — Progress Notes (Signed)
 BP (!) 124/54 (BP Location: Left Arm, Patient Position: Sitting, Cuff Size: Large)   Pulse 79   Temp 98.7 F (37.1 C) (Oral)   Resp 16   Ht 5' (1.524 m)   Wt 168 lb 3.2 oz (76.3 kg)   LMP  (LMP Unknown)   SpO2 97%   BMI 32.85 kg/m    Subjective:    Patient ID: Priscilla [name], female    DOB: Jan 06, 1951, 73 y.o.   MRN: [id number]  HPI: Priscilla [name] is a 73 y.o. female  Chief Complaint  Patient presents with   Anxiety    Feeling about the same, isn't sure how she should feel.    Depression   Back Pain    Tried PT did not help, refuses pain management but wonders why she can't have something to help with the every day pain she experiences. No pain while sitting but always painful when walking.    IRRITABLE BOWEL SYNDROME Levsin  for discomfort.  Took Linzess  in past, but insurance stopped covering. Fever: no Nausea: no Vomiting: no Weight loss: no Decreased appetite: no Diarrhea: no Constipation: no Blood in stool: no Heartburn: no Jaundice: no Rash: no  HYPERLIPIDEMIA History of aortic atherosclerosis on past imaging. Daily smoker 1/2 PPD, has smoked for many years. Not interested in quitting at present. Crestor  tried in past but this caused hip and knee pain. Takes Omega 3 and Vitamin D /B at home. Satisfied with current treatment?  no Side effects:  yes Past cholesterol meds: Crestor  Supplements: none Aspirin:  no The 10-year ASCVD risk score (Arnett DK, et al., 2019) is: 16.6%   Values used to calculate the score:     Age: 57 years     Clincally relevant sex: Female     Is Non-Hispanic African American: No     Diabetic: No     Tobacco smoker: Yes     Systolic Blood Pressure: 124 mmHg     Is BP treated: No     HDL Cholesterol: 39 mg/dL     Total Cholesterol: 158 mg/dL Chest pain:  no Coronary artery disease:  no Family history CAD:  no Family history early CAD:  no   BACK PAIN Chronic in nature. Did see [facility] for left knee pain, had injection to  left knee. Duration: chronic Mechanism of injury: unknown Location: midline, bilateral, and low back Onset: gradual Severity: 6/10 Quality: dull, aching, and throbbing Frequency: intermittent Radiation: none Aggravating factors: movement, walking, and bending Alleviating factors: sitting Status: fluctuating Treatments attempted: rest, ice, heat, and APAP  Relief with NSAIDs?: mild Nighttime pain:  no Paresthesias / decreased sensation:  no Bowel / bladder incontinence:  no Fevers:  no Dysuria / urinary frequency:  no   DEPRESSION Continues on Amitriptyline , has been on this regimen for several years. Progesterone  for menopause symptoms, has been in this for several years. Mood status: stable Satisfied with current treatment?: yes Symptom severity: moderate  Duration of current treatment : chronic Side effects: no Medication compliance: good compliance Psychotherapy/counseling: none Depressed mood: yes on occasion Anxious mood: no Anhedonia: no Significant weight loss or gain: yes Insomnia: yes hard to fall asleep Fatigue: no Feelings of worthlessness or guilt: no Impaired concentration/indecisiveness: no Suicidal ideations: no Hopelessness: no Crying spells: no    01/31/2024    9:46 AM 07/31/2023    8:35 AM 01/30/2023    8:07 AM 10/25/2022    3:14 PM 05/09/2022    4:07 PM  Depression screen  PHQ 2/9  Decreased Interest 0 0 1 0 1  Down, Depressed, Hopeless 1 0 1 1 0  PHQ - 2 Score 1 0 2 1 1   Altered sleeping 0 0 1 3 0  Tired, decreased energy 1 0 1 3 2   Change in appetite 0 3 2 1 1   Feeling bad or failure about yourself  0 0 2 0 0  Trouble concentrating 0 0 1 0 0  Moving slowly or fidgety/restless 0 0 0 0 0  Suicidal thoughts 0 0 0 0 0  PHQ-9 Score 2 3 9 8 4   Difficult doing work/chores  Not difficult at all Very difficult Very difficult Not difficult at all       01/31/2024    9:46 AM 07/31/2023    8:35 AM 01/30/2023    8:08 AM 10/25/2022    3:14 PM  GAD 7 :  Generalized Anxiety Score  Nervous, Anxious, on Edge 0 0 0 0  Control/stop worrying 0 0 1 0  Worry too much - different things 1 0 1 0  Trouble relaxing 0 0 1 0  Restless 0 0 0 0  Easily annoyed or irritable 1 0 1 1  Afraid - awful might happen 0 0 1 0  Total GAD 7 Score 2 0 5 1  Anxiety Difficulty Somewhat difficult Not difficult at all Somewhat difficult Somewhat difficult   Relevant past medical, surgical, family and social history reviewed and updated as indicated. Interim medical history since our last visit reviewed. Allergies and medications reviewed and updated.  Review of Systems  Constitutional:  Negative for activity change, appetite change, diaphoresis, fatigue and fever.  Respiratory:  Negative for cough, chest tightness and shortness of breath.   Cardiovascular:  Negative for chest pain, palpitations and leg swelling.  Gastrointestinal: Negative.   Neurological: Negative.   Psychiatric/Behavioral: Negative.     Per HPI unless specifically indicated above     Objective:    BP (!) 124/54 (BP Location: Left Arm, Patient Position: Sitting, Cuff Size: Large)   Pulse 79   Temp 98.7 F (37.1 C) (Oral)   Resp 16   Ht 5' (1.524 m)   Wt 168 lb 3.2 oz (76.3 kg)   LMP  (LMP Unknown)   SpO2 97%   BMI 32.85 kg/m   Wt Readings from Last 3 Encounters:  01/31/24 168 lb 3.2 oz (76.3 kg)  07/31/23 168 lb 12.8 oz (76.6 kg)  01/30/23 170 lb 3.2 oz (77.2 kg)    Physical Exam Vitals and nursing note reviewed.  Constitutional:      General: She is awake. She is not in acute distress.    Appearance: She is well-developed and well-groomed. She is obese. She is not ill-appearing or toxic-appearing.  HENT:     Head: Normocephalic.     Right Ear: Hearing normal.     Left Ear: Hearing normal.  Eyes:     General: Lids are normal.        Right eye: No discharge.        Left eye: No discharge.     Conjunctiva/sclera: Conjunctivae normal.     Pupils: Pupils are equal, round, and  reactive to light.  Neck:     Thyroid : No thyromegaly.     Vascular: No carotid bruit.  Cardiovascular:     Rate and Rhythm: Normal rate and regular rhythm.     Heart sounds: Normal heart sounds. No murmur heard.    No gallop.  Pulmonary:  Effort: Pulmonary effort is normal. No accessory muscle usage or respiratory distress.     Breath sounds: Normal breath sounds.  Abdominal:     General: Bowel sounds are normal. There is no distension.     Palpations: Abdomen is soft.     Tenderness: There is no abdominal tenderness.     Hernia: No hernia is present.  Musculoskeletal:     Cervical back: Normal range of motion and neck supple.     Right lower leg: No edema.     Left lower leg: No edema.  Skin:    General: Skin is warm and dry.  Neurological:     Mental Status: She is alert and oriented to person, place, and time.  Psychiatric:        Attention and Perception: Attention normal.        Mood and Affect: Mood normal.        Speech: Speech normal.        Behavior: Behavior normal. Behavior is cooperative.        Thought Content: Thought content normal.    Results for orders placed or performed in visit on 07/31/23  Comprehensive metabolic panel   Collection Time: 07/31/23  9:06 AM  Result Value Ref Range   Glucose 91 70 - 99 mg/dL   BUN 13 8 - 27 mg/dL   Creatinine, Ser 9.05 0.57 - 1.00 mg/dL   eGFR 64 >40 fO/fpw/8.26   BUN/Creatinine Ratio 14 12 - 28   Sodium 140 134 - 144 mmol/L   Potassium 4.7 3.5 - 5.2 mmol/L   Chloride 100 96 - 106 mmol/L   CO2 24 20 - 29 mmol/L   Calcium  10.1 8.7 - 10.3 mg/dL   Total Protein 7.0 6.0 - 8.5 g/dL   Albumin 4.1 3.8 - 4.8 g/dL   Globulin, Total 2.9 1.5 - 4.5 g/dL   Bilirubin Total 0.2 0.0 - 1.2 mg/dL   Alkaline Phosphatase 80 44 - 121 IU/L   AST 19 0 - 40 IU/L   ALT 22 0 - 32 IU/L  Lipid Panel w/o Chol/HDL Ratio   Collection Time: 07/31/23  9:06 AM  Result Value Ref Range   Cholesterol, Total 158 100 - 199 mg/dL    Triglycerides 784 (H) 0 - 149 mg/dL   HDL 39 (L) >60 mg/dL   VLDL Cholesterol Cal 36 5 - 40 mg/dL   LDL Chol Calc (NIH) 83 0 - 99 mg/dL  TSH   Collection Time: 07/31/23  9:06 AM  Result Value Ref Range   TSH 4.410 0.450 - 4.500 uIU/mL      Assessment & Plan:   Problem List Items Addressed This Visit       Cardiovascular and Mediastinum   Aortic atherosclerosis   Noted on imaging 04/13/21.  Would benefit from statin therapy, refuses at this time.  Recommend complete cessation of smoking.      Relevant Orders   Comprehensive metabolic panel with GFR   Lipid Panel w/o Chol/HDL Ratio     Other   Vitamin D  deficiency   Chronic, ongoing, recheck Vit D level today and continue current supplement.  Recommend she obtain DEXA scan.      Relevant Orders   VITAMIN D  25 Hydroxy (Vit-D Deficiency, Fractures)   Obesity   BMI 32.85, she is very frustrated with weight gain, is working on this.  Discussed with her activities she could try like elliptical, stationary bike, weights that will not strain back, and pool exercises.  Offered support and listening.  Recommended eating smaller high protein, low fat meals more frequently and exercising 30 mins a day 5 times a week with a goal of 10-15lb weight loss in the next 3 months. Patient voiced their understanding and motivation to adhere to these recommendations.       Nicotine dependence, cigarettes, uncomplicated   I have recommended complete cessation of tobacco use. I have discussed various options available for assistance with tobacco cessation including over the counter methods (Nicotine gum, patch and lozenges). We also discussed prescription options (Chantix, Nicotine Inhaler / Nasal Spray). The patient is not interested in pursuing any prescription tobacco cessation options at this time.  Refuses lung cancer screening.       Mixed hyperlipidemia   Chronic with ASCVD 16.6%, Crestor  caused severe hip and knee pain, even with first dose.   Lipid panel today and continue to discuss.      Relevant Orders   Comprehensive metabolic panel with GFR   Lipid Panel w/o Chol/HDL Ratio   Menopause   Chronic, stable on current medication regimen (compounded Progesterone  125MG  daily).  Continue current regimen.  Script to be faxed to pharmacy in [place] as needed.  She is aware of long term risks of hormone use and refuses to taper off.  She plans to work on reduction to discontinuation over the next year.      Relevant Orders   CBC with Differential/Platelet   Low back pain   Chronic, discussed with her option of seeing neurosurgery to further discuss or physiatry to look into injections.  Educated her on this.  Suspect it would offer her benefit and improved quality of life. Discussed with her would avoid opioids due to her IBS and constipation, this may worsen symptoms. She will alert provider if wishes to pursue an option we discussed.      Depression - Primary   Chronic, ongoing.  She wishes to continue Amitriptyline  at this time.  Will trial Wellbutrin  again, may benefit her anhedonia.  Would avoid Duloxetine, although may help pain, concern for weight gain with this.  May benefit from SSRI, but she is concerned about weight gain.  Recommend she look into pool aerobics at [facility] which may benefit both mood and arthritis.      Relevant Medications   buPROPion  (WELLBUTRIN  XL) 150 MG 24 hr tablet   amitriptyline  (ELAVIL ) 25 MG tablet   Other Relevant Orders   TSH   B12 deficiency   Noted on past labs, is taking oral supplement.  Recheck B12 level today and if remains on lower side consider monthly injections.      Relevant Orders   CBC with Differential/Platelet   Vitamin B12     Follow up plan: Return in about 4 weeks (around 02/28/2024) for MOOD -- added Wellbutrin  150 MG XL.

## 2024-02-01 ENCOUNTER — Ambulatory Visit: Admitting: Nurse Practitioner

## 2024-02-01 ENCOUNTER — Ambulatory Visit: Payer: Self-pay | Admitting: Nurse Practitioner

## 2024-02-01 DIAGNOSIS — E039 Hypothyroidism, unspecified: Secondary | ICD-10-CM

## 2024-02-01 DIAGNOSIS — R7989 Other specified abnormal findings of blood chemistry: Secondary | ICD-10-CM

## 2024-02-01 NOTE — Progress Notes (Signed)
 Contacted via MyChart  Good morning Priscilla [name], not all labs are back quite yet, but some are: - CBC overall stable with no anemia or infection. - Lipid panel continue to show elevations, I know you have not tolerated statins but in future we could consider non statin therapy like Zetia, Praluent, or Repatha. Definitely think about these for stroke prevention and heart protection. - Vitamin D  level a little low, ensure you are taking supplement daily. - Kidney function, creatinine and eGFR, remains normal, as is liver function, AST and ALT. Will let you know when they rest return.  Any questions? Keep being stellar!!  Thank you for allowing me to participate in your care.  I appreciate you. Kindest regards, Sam Overbeck

## 2024-02-02 LAB — CBC WITH DIFFERENTIAL/PLATELET
Basophils Absolute: 0.1 x10E3/uL (ref 0.0–0.2)
Basos: 1 %
EOS (ABSOLUTE): 0.2 x10E3/uL (ref 0.0–0.4)
Eos: 2 %
Hematocrit: 47.1 % — ABNORMAL HIGH (ref 34.0–46.6)
Hemoglobin: 15.3 g/dL (ref 11.1–15.9)
Immature Grans (Abs): 0 x10E3/uL (ref 0.0–0.1)
Immature Granulocytes: 0 %
Lymphocytes Absolute: 2.2 x10E3/uL (ref 0.7–3.1)
Lymphs: 25 %
MCH: 33.4 pg — ABNORMAL HIGH (ref 26.6–33.0)
MCHC: 32.5 g/dL (ref 31.5–35.7)
MCV: 103 fL — ABNORMAL HIGH (ref 79–97)
Monocytes Absolute: 0.6 x10E3/uL (ref 0.1–0.9)
Monocytes: 7 %
Neutrophils Absolute: 5.7 x10E3/uL (ref 1.4–7.0)
Neutrophils: 65 %
Platelets: 217 x10E3/uL (ref 150–450)
RBC: 4.58 x10E6/uL (ref 3.77–5.28)
RDW: 12.1 % (ref 11.7–15.4)
WBC: 8.8 x10E3/uL (ref 3.4–10.8)

## 2024-02-02 LAB — COMPREHENSIVE METABOLIC PANEL WITH GFR
ALT: 24 IU/L (ref 0–32)
AST: 14 IU/L (ref 0–40)
Albumin: 4.1 g/dL (ref 3.8–4.8)
Alkaline Phosphatase: 95 IU/L (ref 49–135)
BUN/Creatinine Ratio: 17 (ref 12–28)
BUN: 15 mg/dL (ref 8–27)
Bilirubin Total: 0.3 mg/dL (ref 0.0–1.2)
CO2: 24 mmol/L (ref 20–29)
Calcium: 9.7 mg/dL (ref 8.7–10.3)
Chloride: 100 mmol/L (ref 96–106)
Creatinine, Ser: 0.89 mg/dL (ref 0.57–1.00)
Globulin, Total: 2.8 g/dL (ref 1.5–4.5)
Glucose: 88 mg/dL (ref 70–99)
Potassium: 4.7 mmol/L (ref 3.5–5.2)
Sodium: 137 mmol/L (ref 134–144)
Total Protein: 6.9 g/dL (ref 6.0–8.5)
eGFR: 69 mL/min/1.73 (ref >59)

## 2024-02-02 LAB — LIPID PANEL W/O CHOL/HDL RATIO
Cholesterol, Total: 203 mg/dL — ABNORMAL HIGH (ref 100–199)
HDL: 40 mg/dL (ref >39)
LDL Chol Calc (NIH): 135 mg/dL — ABNORMAL HIGH (ref 0–99)
Triglycerides: 155 mg/dL — ABNORMAL HIGH (ref 0–149)
VLDL Cholesterol Cal: 28 mg/dL (ref 5–40)

## 2024-02-02 LAB — VITAMIN B12: Vitamin B-12: 581 pg/mL (ref 232–1245)

## 2024-02-02 LAB — TSH: TSH: 4.69 u[IU]/mL — ABNORMAL HIGH (ref 0.450–4.500)

## 2024-02-02 LAB — VITAMIN D 25 HYDROXY (VIT D DEFICIENCY, FRACTURES): Vit D, 25-Hydroxy: 28.5 ng/mL — ABNORMAL LOW (ref 30.0–100.0)

## 2024-02-03 NOTE — Progress Notes (Signed)
 Needs lab only visit in 4 weeks please to recheck thyroid  Remainder of labs stable with exception of mild elevation in TSH, thyroid  lab, would like to recheck in 4 weeks outpatient please.

## 2024-02-05 NOTE — Progress Notes (Signed)
 Called patient and left a message for her to call back to get scheduled for a lab only appt in 4 weeks

## 2024-02-06 NOTE — Progress Notes (Signed)
 Called patient and left a message for her to call back to get scheduled for labs in 4 weeks.

## 2024-02-11 NOTE — Progress Notes (Signed)
 Called patient and left a message for her to call back to get scheduled for labs in 4 weeks.

## 2024-02-13 ENCOUNTER — Telehealth: Payer: Self-pay

## 2024-02-13 NOTE — Telephone Encounter (Signed)
 Ok for E2C2 to review.  Called to see if patient would be open to the mobile mammogram bus coming to our office on 02/29/2024 or 04/07/2024. If patient is open to either of these please, ask a preferred time and we will do our best to accommodate and let patient know when she is scheduled.   If she is not able to make either of these days but does want to proceed with scheduling her overdue mammogram please let patient know she will be contacted for scheduling and if she has a mychart directions will be sent as well for how to self schedule.

## 2024-02-14 ENCOUNTER — Ambulatory Visit

## 2024-03-02 NOTE — Patient Instructions (Incomplete)
 Managing Depression, Adult Depression is a mental health condition that affects your thoughts, feelings, and actions. Being diagnosed with depression can bring you relief if you did not know why you have felt or behaved a certain way. It could also leave you feeling overwhelmed. Finding ways to manage your symptoms can help you feel more positive about your future. How to manage lifestyle changes Being depressed is difficult. Depression can increase the level of everyday stress. Stress can make depression symptoms worse. You may believe your symptoms cannot be managed or will never improve. However, there are many things you can try to help manage your symptoms. There is hope. Managing stress  Stress is your body's reaction to life changes and events, both good and bad. Stress can add to your feelings of depression. Learning to manage your stress can help lessen your feelings of depression. Try some of the following approaches to reducing your stress (stress reduction techniques): Listen to music that you enjoy and that inspires you. Try using a meditation app or take a meditation class. Develop a practice that helps you connect with your spiritual self. Walk in nature, pray, or go to a place of worship. Practice deep breathing. To do this, inhale slowly through your nose. Pause at the top of your inhale for a few seconds and then exhale slowly, letting yourself relax. Repeat this three or four times. Practice yoga to help relax and work your muscles. Choose a stress reduction technique that works for you. These techniques take time and practice to develop. Set aside 5-15 minutes a day to do them. Therapists can offer training in these techniques. Do these things to help manage stress: Keep a journal. Know your limits. Set healthy boundaries for yourself and others, such as saying "no" when you think something is too much. Pay attention to how you react to certain situations. You may not be able to  control everything, but you can change your reaction. Add humor to your life by watching funny movies or shows. Make time for activities that you enjoy and that relax you. Spend less time using electronics, especially at night before bed. The light from screens can make your brain think it is time to get up rather than go to bed.  Medicines Medicines, such as antidepressants, are often a part of treatment for depression. Talk with your pharmacist or health care provider about all the medicines, supplements, and herbal products that you take, their possible side effects, and what medicines and other products are safe to take together. Make sure to report any side effects you may have to your health care provider. Relationships Your health care provider may suggest family therapy, couples therapy, or individual therapy as part of your treatment. How to recognize changes Everyone responds differently to treatment for depression. As you recover from depression, you may start to: Have more interest in doing activities. Feel more hopeful. Have more energy. Eat a more regular amount of food. Have better mental focus. It is important to recognize if your depression is not getting better or is getting worse. The symptoms you had in the beginning may return, such as: Feeling tired. Eating too much or too little. Sleeping too much or too little. Feeling restless, agitated, or hopeless. Trouble focusing or making decisions. Having unexplained aches and pains. Feeling irritable, angry, or aggressive. If you or your family members notice these symptoms coming back, let your health care provider know right away. Follow these instructions at home: Activity Try to  get some form of exercise each day, such as walking. Try yoga, mindfulness, or other stress reduction techniques. Participate in group activities if you are able. Lifestyle Get enough sleep. Cut down on or stop using caffeine, tobacco,  alcohol, and any other harmful substances. Eat a healthy diet that includes plenty of vegetables, fruits, whole grains, low-fat dairy products, and lean protein. Limit foods that are high in solid fats, added sugar, or salt (sodium). General instructions Take over-the-counter and prescription medicines only as told by your health care provider. Keep all follow-up visits. It is important for your health care provider to check on your mood, behavior, and medicines. Your health care provider may need to make changes to your treatment. Where to find support Talking to others  Friends and family members can be sources of support and guidance. Talk to trusted friends or family members about your condition. Explain your symptoms and let them know that you are working with a health care provider to treat your depression. Tell friends and family how they can help. Finances Find mental health providers that fit with your financial situation. Talk with your health care provider if you are worried about access to food, housing, or medicine. Call your insurance company to learn about your co-pays and prescription plan. Where to find more information You can find support in your area from: Anxiety and Depression Association of America (ADAA): adaa.org Mental Health [place]: mentalhealthamerica.net [employer] on Mental Illness: nami.org Contact a health care provider if: You stop taking your antidepressant medicines, and you have any of these symptoms: Nausea. Headache. Light-headedness. Chills and body aches. Not being able to sleep (insomnia). You or your friends and family think your depression is getting worse. Get help right away if: You have thoughts of hurting yourself or others. Get help right away if you feel like you may hurt yourself or others, or have thoughts about taking your own life. Go to your nearest emergency room or: Call 911. Call the National Suicide Prevention Lifeline at  (215) 435-0408 or 988. This is open 24 hours a day. Text the Crisis Text Line at 318 581 7774. This information is not intended to replace advice given to you by your health care provider. Make sure you discuss any questions you have with your health care provider. Document Revised: 08/30/2021 Document Reviewed: 08/30/2021 Elsevier Patient Education  2024 [employer].

## 2024-03-04 ENCOUNTER — Ambulatory Visit: Admitting: Nurse Practitioner

## 2024-03-16 NOTE — Patient Instructions (Incomplete)
 Managing Depression, Adult Depression is a mental health condition that affects your thoughts, feelings, and actions. Being diagnosed with depression can bring you relief if you did not know why you have felt or behaved a certain way. It could also leave you feeling overwhelmed. Finding ways to manage your symptoms can help you feel more positive about your future. How to manage lifestyle changes Being depressed is difficult. Depression can increase the level of everyday stress. Stress can make depression symptoms worse. You may believe your symptoms cannot be managed or will never improve. However, there are many things you can try to help manage your symptoms. There is hope. Managing stress  Stress is your body's reaction to life changes and events, both good and bad. Stress can add to your feelings of depression. Learning to manage your stress can help lessen your feelings of depression. Try some of the following approaches to reducing your stress (stress reduction techniques): Listen to music that you enjoy and that inspires you. Try using a meditation app or take a meditation class. Develop a practice that helps you connect with your spiritual self. Walk in nature, pray, or go to a place of worship. Practice deep breathing. To do this, inhale slowly through your nose. Pause at the top of your inhale for a few seconds and then exhale slowly, letting yourself relax. Repeat this three or four times. Practice yoga to help relax and work your muscles. Choose a stress reduction technique that works for you. These techniques take time and practice to develop. Set aside 5-15 minutes a day to do them. Therapists can offer training in these techniques. Do these things to help manage stress: Keep a journal. Know your limits. Set healthy boundaries for yourself and others, such as saying no when you think something is too much. Pay attention to how you react to certain situations. You may not be able to  control everything, but you can change your reaction. Add humor to your life by watching funny movies or shows. Make time for activities that you enjoy and that relax you. Spend less time using electronics, especially at night before bed. The light from screens can make your brain think it is time to get up rather than go to bed.  Medicines Medicines, such as antidepressants, are often a part of treatment for depression. Talk with your pharmacist or health care provider about all the medicines, supplements, and herbal products that you take, their possible side effects, and what medicines and other products are safe to take together. Make sure to report any side effects you may have to your health care provider. Relationships Your health care provider may suggest family therapy, couples therapy, or individual therapy as part of your treatment. How to recognize changes Everyone responds differently to treatment for depression. As you recover from depression, you may start to: Have more interest in doing activities. Feel more hopeful. Have more energy. Eat a more regular amount of food. Have better mental focus. It is important to recognize if your depression is not getting better or is getting worse. The symptoms you had in the beginning may return, such as: Feeling tired. Eating too much or too little. Sleeping too much or too little. Feeling restless, agitated, or hopeless. Trouble focusing or making decisions. Having unexplained aches and pains. Feeling irritable, angry, or aggressive. If you or your family members notice these symptoms coming back, let your health care provider know right away. Follow these instructions at home: Activity Try to  get some form of exercise each day, such as walking. Try yoga, mindfulness, or other stress reduction techniques. Participate in group activities if you are able. Lifestyle Get enough sleep. Cut down on or stop using caffeine, tobacco,  alcohol, and any other harmful substances. Eat a healthy diet that includes plenty of vegetables, fruits, whole grains, low-fat dairy products, and lean protein. Limit foods that are high in solid fats, added sugar, or salt (sodium). General instructions Take over-the-counter and prescription medicines only as told by your health care provider. Keep all follow-up visits. It is important for your health care provider to check on your mood, behavior, and medicines. Your health care provider may need to make changes to your treatment. Where to find support Talking to others  Friends and family members can be sources of support and guidance. Talk to trusted friends or family members about your condition. Explain your symptoms and let them know that you are working with a health care provider to treat your depression. Tell friends and family how they can help. Finances Find mental health providers that fit with your financial situation. Talk with your health care provider if you are worried about access to food, housing, or medicine. Call your insurance company to learn about your co-pays and prescription plan. Where to find more information You can find support in your area from: Anxiety and Depression Association of America (ADAA): adaa.org Mental Health America: mentalhealthamerica.net [employer] on Mental Illness: nami.org Contact a health care provider if: You stop taking your antidepressant medicines, and you have any of these symptoms: Nausea. Headache. Light-headedness. Chills and body aches. Not being able to sleep (insomnia). You or your friends and family think your depression is getting worse. Get help right away if: You have thoughts of hurting yourself or others. Get help right away if you feel like you may hurt yourself or others, or have thoughts about taking your own life. Go to your nearest emergency room or: Call 911. Call the National Suicide Prevention Lifeline at  614 338 8653 or 988. This is open 24 hours a day. Text the Crisis Text Line at 254-763-3507. This information is not intended to replace advice given to you by your health care provider. Make sure you discuss any questions you have with your health care provider. Document Revised: 08/30/2021 Document Reviewed: 08/30/2021 Elsevier Patient Education  2024 [employer].

## 2024-03-19 ENCOUNTER — Encounter: Payer: Self-pay | Admitting: Nurse Practitioner

## 2024-03-19 ENCOUNTER — Ambulatory Visit (INDEPENDENT_AMBULATORY_CARE_PROVIDER_SITE_OTHER): Admitting: Nurse Practitioner

## 2024-03-19 VITALS — BP 128/68 | HR 82 | Temp 98.4°F | Resp 18 | Ht 60.0 in | Wt 171.6 lb

## 2024-03-19 DIAGNOSIS — Z6833 Body mass index (BMI) 33.0-33.9, adult: Secondary | ICD-10-CM

## 2024-03-19 DIAGNOSIS — F3341 Major depressive disorder, recurrent, in partial remission: Secondary | ICD-10-CM | POA: Diagnosis not present

## 2024-03-19 DIAGNOSIS — E6609 Other obesity due to excess calories: Secondary | ICD-10-CM | POA: Diagnosis not present

## 2024-03-19 DIAGNOSIS — E66811 Obesity, class 1: Secondary | ICD-10-CM

## 2024-03-19 DIAGNOSIS — F17210 Nicotine dependence, cigarettes, uncomplicated: Secondary | ICD-10-CM | POA: Diagnosis not present

## 2024-03-19 NOTE — Assessment & Plan Note (Signed)
 Chronic, ongoing.  Denies SI/HI. She wishes to continue Amitriptyline  at this time.  Is tolerating Wellbutrin  well and noticing less fatigue, continue this. Would avoid Duloxetine, although may help pain, concern for weight gain with this.  May benefit from SSRI, but she is concerned about weight gain.  Recommend she look into pool aerobics at [facility] which may benefit both mood and arthritis.

## 2024-03-19 NOTE — Progress Notes (Signed)
 BP 128/68 (BP Location: Left Arm, Patient Position: Sitting)   Pulse 82   Temp 98.4 F (36.9 C) (Oral)   Resp 18   Ht 5' (1.524 m)   Wt 171 lb 9.6 oz (77.8 kg)   LMP  (LMP Unknown)   SpO2 96% Comment: room air  BMI 33.51 kg/m    Subjective:    Patient ID: Priscilla [name], female    DOB: 1950/07/31, 73 y.o.   MRN: [id number]  HPI: Priscilla [name] is a 73 y.o. female  Chief Complaint  Patient presents with   Depression   DEPRESSION/FATIGUE Started on Wellbutrin  XL 150 MG at visit on 01/31/24. Tolerating well and is noticing less fatigue. Seems to be helping her cut back on smoking as well. Mood status: improving Satisfied with current treatment?: yes Symptom severity: mild  Duration of current treatment : chronic Side effects: no Medication compliance: good compliance Psychotherapy/counseling: none Depressed mood: no Anxious mood: no Anhedonia: no Significant weight loss or gain: no Insomnia: none Fatigue: a little bit better Feelings of worthlessness or guilt: no Impaired concentration/indecisiveness: no Suicidal ideations: no Hopelessness: no Crying spells: no    03/19/2024    1:53 PM 01/31/2024    9:46 AM 07/31/2023    8:35 AM 01/30/2023    8:07 AM 10/25/2022    3:14 PM  Depression screen PHQ 2/9  Decreased Interest 2 0 0 1 0  Down, Depressed, Hopeless 1 1 0 1 1  PHQ - 2 Score 3 1 0 2 1  Altered sleeping 1 0 0 1 3  Tired, decreased energy 1 1 0 1 3  Change in appetite 2 0 3 2 1   Feeling bad or failure about yourself  2 0 0 2 0  Trouble concentrating 0 0 0 1 0  Moving slowly or fidgety/restless 0 0 0 0 0  Suicidal thoughts 0 0 0 0 0  PHQ-9 Score 9 2  3  9  8    Difficult doing work/chores Not difficult at all  Not difficult at all Very difficult Very difficult     Data saved with a previous flowsheet row definition       03/19/2024    1:54 PM 01/31/2024    9:46 AM 07/31/2023    8:35 AM 01/30/2023    8:08 AM  GAD 7 : Generalized Anxiety Score  Nervous,  Anxious, on Edge 0 0 0 0  Control/stop worrying 0 0 0 1  Worry too much - different things 1 1 0 1  Trouble relaxing 0 0 0 1  Restless 0 0 0 0  Easily annoyed or irritable 1 1 0 1  Afraid - awful might happen 0 0 0 1  Total GAD 7 Score 2 2 0 5  Anxiety Difficulty Somewhat difficult Somewhat difficult Not difficult at all Somewhat difficult   Relevant past medical, surgical, family and social history reviewed and updated as indicated. Interim medical history since our last visit reviewed. Allergies and medications reviewed and updated.  Review of Systems  Constitutional:  Negative for activity change, appetite change, diaphoresis, fatigue and fever.  Respiratory:  Negative for cough, chest tightness and shortness of breath.   Cardiovascular:  Negative for chest pain, palpitations and leg swelling.  Gastrointestinal: Negative.   Neurological: Negative.   Psychiatric/Behavioral: Negative.      Per HPI unless specifically indicated above     Objective:    BP 128/68 (BP Location: Left Arm, Patient Position: Sitting)   Pulse  82   Temp 98.4 F (36.9 C) (Oral)   Resp 18   Ht 5' (1.524 m)   Wt 171 lb 9.6 oz (77.8 kg)   LMP  (LMP Unknown)   SpO2 96% Comment: room air  BMI 33.51 kg/m   Wt Readings from Last 3 Encounters:  03/19/24 171 lb 9.6 oz (77.8 kg)  01/31/24 168 lb 3.2 oz (76.3 kg)  07/31/23 168 lb 12.8 oz (76.6 kg)    Physical Exam Vitals and nursing note reviewed.  Constitutional:      General: She is awake. She is not in acute distress.    Appearance: She is well-developed and well-groomed. She is obese. She is not ill-appearing or toxic-appearing.  HENT:     Head: Normocephalic.     Right Ear: Hearing normal.     Left Ear: Hearing normal.  Eyes:     General: Lids are normal.        Right eye: No discharge.        Left eye: No discharge.     Conjunctiva/sclera: Conjunctivae normal.     Pupils: Pupils are equal, round, and reactive to light.  Neck:     Thyroid :  No thyromegaly.     Vascular: No carotid bruit.  Cardiovascular:     Rate and Rhythm: Normal rate and regular rhythm.     Heart sounds: Normal heart sounds. No murmur heard.    No gallop.  Pulmonary:     Effort: Pulmonary effort is normal. No accessory muscle usage or respiratory distress.     Breath sounds: Normal breath sounds.  Abdominal:     General: Bowel sounds are normal. There is no distension.     Palpations: Abdomen is soft.     Tenderness: There is no abdominal tenderness.     Hernia: No hernia is present.  Musculoskeletal:     Cervical back: Normal range of motion and neck supple.     Right lower leg: No edema.     Left lower leg: No edema.  Skin:    General: Skin is warm and dry.  Neurological:     Mental Status: She is alert and oriented to person, place, and time.  Psychiatric:        Attention and Perception: Attention normal.        Mood and Affect: Mood normal.        Speech: Speech normal.        Behavior: Behavior normal. Behavior is cooperative.        Thought Content: Thought content normal.     Results for orders placed or performed in visit on 01/31/24  CBC with Differential/Platelet   Collection Time: 01/31/24 10:29 AM  Result Value Ref Range   WBC 8.8 3.4 - 10.8 x10E3/uL   RBC 4.58 3.77 - 5.28 x10E6/uL   Hemoglobin 15.3 11.1 - 15.9 g/dL   Hematocrit 52.8 (H) 65.9 - 46.6 %   MCV 103 (H) 79 - 97 fL   MCH 33.4 (H) 26.6 - 33.0 pg   MCHC 32.5 31.5 - 35.7 g/dL   RDW 87.8 88.2 - 84.5 %   Platelets 217 150 - 450 x10E3/uL   Neutrophils 65 Not Estab. %   Lymphs 25 Not Estab. %   Monocytes 7 Not Estab. %   Eos 2 Not Estab. %   Basos 1 Not Estab. %   Neutrophils Absolute 5.7 1.4 - 7.0 x10E3/uL   Lymphocytes Absolute 2.2 0.7 - 3.1 x10E3/uL   Monocytes  Absolute 0.6 0.1 - 0.9 x10E3/uL   EOS (ABSOLUTE) 0.2 0.0 - 0.4 x10E3/uL   Basophils Absolute 0.1 0.0 - 0.2 x10E3/uL   Immature Granulocytes 0 Not Estab. %   Immature Grans (Abs) 0.0 0.0 - 0.1 x10E3/uL   Comprehensive metabolic panel with GFR   Collection Time: 01/31/24 10:29 AM  Result Value Ref Range   Glucose 88 70 - 99 mg/dL   BUN 15 8 - 27 mg/dL   Creatinine, Ser 9.10 0.57 - 1.00 mg/dL   eGFR 69 >40 fO/fpw/8.26   BUN/Creatinine Ratio 17 12 - 28   Sodium 137 134 - 144 mmol/L   Potassium 4.7 3.5 - 5.2 mmol/L   Chloride 100 96 - 106 mmol/L   CO2 24 20 - 29 mmol/L   Calcium  9.7 8.7 - 10.3 mg/dL   Total Protein 6.9 6.0 - 8.5 g/dL   Albumin 4.1 3.8 - 4.8 g/dL   Globulin, Total 2.8 1.5 - 4.5 g/dL   Bilirubin Total 0.3 0.0 - 1.2 mg/dL   Alkaline Phosphatase 95 49 - 135 IU/L   AST 14 0 - 40 IU/L   ALT 24 0 - 32 IU/L  Lipid Panel w/o Chol/HDL Ratio   Collection Time: 01/31/24 10:29 AM  Result Value Ref Range   Cholesterol, Total 203 (H) 100 - 199 mg/dL   Triglycerides 844 (H) 0 - 149 mg/dL   HDL 40 >60 mg/dL   VLDL Cholesterol Cal 28 5 - 40 mg/dL   LDL Chol Calc (NIH) 864 (H) 0 - 99 mg/dL  TSH   Collection Time: 01/31/24 10:29 AM  Result Value Ref Range   TSH 4.690 (H) 0.450 - 4.500 uIU/mL  VITAMIN D  25 Hydroxy (Vit-D Deficiency, Fractures)   Collection Time: 01/31/24 10:29 AM  Result Value Ref Range   Vit D, 25-Hydroxy 28.5 (L) 30.0 - 100.0 ng/mL  Vitamin B12   Collection Time: 01/31/24 10:29 AM  Result Value Ref Range   Vitamin B-12 581 232 - 1,245 pg/mL      Assessment & Plan:   Problem List Items Addressed This Visit       Other   Obesity   BMI 32.85, she is very frustrated with weight gain, is working on this.  Discussed with her activities she could try like elliptical, stationary bike, weights that will not strain back, and pool exercises. Offered support and listening.  Recommended eating smaller high protein, low fat meals more frequently and exercising 30 mins a day 5 times a week with a goal of 10-15lb weight loss in the next 3 months. Patient voiced their understanding and motivation to adhere to these recommendations. Could consider Contrave if covered,  discussed with her today.      Nicotine dependence, cigarettes, uncomplicated   I have recommended complete cessation of tobacco use. She is tolerating Wellbutrin  and it is helping her cut back. Refuses lung cancer screening.       Depression - Primary   Chronic, ongoing.  Denies SI/HI. She wishes to continue Amitriptyline  at this time.  Is tolerating Wellbutrin  well and noticing less fatigue, continue this. Would avoid Duloxetine, although may help pain, concern for weight gain with this.  May benefit from SSRI, but she is concerned about weight gain.  Recommend she look into [facility] aerobics at [facility] which may benefit both mood and arthritis.        Follow up plan: Return in about 6 months (around 09/16/2024) for Depression, ANXIETY, Back Pain, HTN/HLD.

## 2024-03-19 NOTE — Assessment & Plan Note (Signed)
 BMI 32.85, she is very frustrated with weight gain, is working on this.  Discussed with her activities she could try like elliptical, stationary bike, weights that will not strain back, and pool exercises. Offered support and listening.  Recommended eating smaller high protein, low fat meals more frequently and exercising 30 mins a day 5 times a week with a goal of 10-15lb weight loss in the next 3 months. Patient voiced their understanding and motivation to adhere to these recommendations. Could consider Contrave if covered, discussed with her today.

## 2024-03-19 NOTE — Assessment & Plan Note (Signed)
 I have recommended complete cessation of tobacco use. She is tolerating Wellbutrin  and it is helping her cut back. Refuses lung cancer screening.

## 2024-09-15 ENCOUNTER — Ambulatory Visit: Admitting: Nurse Practitioner
